# Patient Record
Sex: Male | Born: 1945
Health system: Southern US, Community
[De-identification: ages and names within clinical notes are randomized; demographics above are authoritative.]

## PROBLEM LIST (undated history)

## (undated) DIAGNOSIS — K219 Gastro-esophageal reflux disease without esophagitis: Secondary | ICD-10-CM

## (undated) DIAGNOSIS — T7840XA Allergy, unspecified, initial encounter: Secondary | ICD-10-CM

## (undated) DIAGNOSIS — E785 Hyperlipidemia, unspecified: Secondary | ICD-10-CM

## (undated) DIAGNOSIS — H409 Unspecified glaucoma: Secondary | ICD-10-CM

## (undated) HISTORY — PX: ROTATOR CUFF REPAIR: SHX139

## (undated) HISTORY — DX: Unspecified glaucoma: H40.9

## (undated) HISTORY — DX: Gastro-esophageal reflux disease without esophagitis: K21.9

## (undated) HISTORY — DX: Hyperlipidemia, unspecified: E78.5

## (undated) HISTORY — DX: Allergy, unspecified, initial encounter: T78.40XA

## (undated) HISTORY — PX: BACK SURGERY: SHX140

---

## 2016-12-03 DIAGNOSIS — E8881 Metabolic syndrome: Secondary | ICD-10-CM | POA: Diagnosis not present

## 2016-12-03 DIAGNOSIS — E669 Obesity, unspecified: Secondary | ICD-10-CM | POA: Diagnosis not present

## 2016-12-03 DIAGNOSIS — Z6832 Body mass index (BMI) 32.0-32.9, adult: Secondary | ICD-10-CM | POA: Diagnosis not present

## 2016-12-03 DIAGNOSIS — E1151 Type 2 diabetes mellitus with diabetic peripheral angiopathy without gangrene: Secondary | ICD-10-CM | POA: Diagnosis not present

## 2016-12-03 DIAGNOSIS — M75101 Unspecified rotator cuff tear or rupture of right shoulder, not specified as traumatic: Secondary | ICD-10-CM | POA: Diagnosis not present

## 2016-12-03 DIAGNOSIS — E782 Mixed hyperlipidemia: Secondary | ICD-10-CM | POA: Diagnosis not present

## 2016-12-03 DIAGNOSIS — Z0000 Encounter for general adult medical examination without abnormal findings: Secondary | ICD-10-CM | POA: Diagnosis not present

## 2016-12-03 DIAGNOSIS — J309 Allergic rhinitis, unspecified: Secondary | ICD-10-CM | POA: Diagnosis not present

## 2016-12-07 DIAGNOSIS — M47892 Other spondylosis, cervical region: Secondary | ICD-10-CM | POA: Diagnosis not present

## 2016-12-07 DIAGNOSIS — M5412 Radiculopathy, cervical region: Secondary | ICD-10-CM | POA: Diagnosis not present

## 2016-12-07 DIAGNOSIS — M19011 Primary osteoarthritis, right shoulder: Secondary | ICD-10-CM | POA: Diagnosis not present

## 2016-12-17 DIAGNOSIS — R7303 Prediabetes: Secondary | ICD-10-CM | POA: Diagnosis not present

## 2016-12-17 DIAGNOSIS — D519 Vitamin B12 deficiency anemia, unspecified: Secondary | ICD-10-CM | POA: Diagnosis not present

## 2016-12-17 DIAGNOSIS — M1990 Unspecified osteoarthritis, unspecified site: Secondary | ICD-10-CM | POA: Diagnosis not present

## 2016-12-17 DIAGNOSIS — E782 Mixed hyperlipidemia: Secondary | ICD-10-CM | POA: Diagnosis not present

## 2016-12-17 DIAGNOSIS — J309 Allergic rhinitis, unspecified: Secondary | ICD-10-CM | POA: Diagnosis not present

## 2016-12-17 DIAGNOSIS — N401 Enlarged prostate with lower urinary tract symptoms: Secondary | ICD-10-CM | POA: Diagnosis not present

## 2016-12-17 DIAGNOSIS — E559 Vitamin D deficiency, unspecified: Secondary | ICD-10-CM | POA: Diagnosis not present

## 2016-12-17 DIAGNOSIS — E669 Obesity, unspecified: Secondary | ICD-10-CM | POA: Diagnosis not present

## 2016-12-17 DIAGNOSIS — N400 Enlarged prostate without lower urinary tract symptoms: Secondary | ICD-10-CM | POA: Diagnosis not present

## 2017-01-18 DIAGNOSIS — G4730 Sleep apnea, unspecified: Secondary | ICD-10-CM | POA: Diagnosis not present

## 2017-01-18 DIAGNOSIS — E782 Mixed hyperlipidemia: Secondary | ICD-10-CM | POA: Diagnosis not present

## 2017-01-18 DIAGNOSIS — Z6832 Body mass index (BMI) 32.0-32.9, adult: Secondary | ICD-10-CM | POA: Diagnosis not present

## 2017-01-18 DIAGNOSIS — E1140 Type 2 diabetes mellitus with diabetic neuropathy, unspecified: Secondary | ICD-10-CM | POA: Diagnosis not present

## 2017-01-18 DIAGNOSIS — E559 Vitamin D deficiency, unspecified: Secondary | ICD-10-CM | POA: Diagnosis not present

## 2017-02-22 DIAGNOSIS — G629 Polyneuropathy, unspecified: Secondary | ICD-10-CM | POA: Diagnosis not present

## 2017-02-22 DIAGNOSIS — R0789 Other chest pain: Secondary | ICD-10-CM | POA: Diagnosis not present

## 2017-02-22 DIAGNOSIS — R079 Chest pain, unspecified: Secondary | ICD-10-CM | POA: Diagnosis not present

## 2017-02-22 DIAGNOSIS — M79602 Pain in left arm: Secondary | ICD-10-CM | POA: Diagnosis not present

## 2017-02-22 DIAGNOSIS — I10 Essential (primary) hypertension: Secondary | ICD-10-CM | POA: Diagnosis not present

## 2017-02-22 DIAGNOSIS — E7800 Pure hypercholesterolemia, unspecified: Secondary | ICD-10-CM | POA: Diagnosis not present

## 2017-02-22 DIAGNOSIS — R69 Illness, unspecified: Secondary | ICD-10-CM | POA: Diagnosis not present

## 2017-03-31 DIAGNOSIS — Z713 Dietary counseling and surveillance: Secondary | ICD-10-CM | POA: Diagnosis not present

## 2017-03-31 DIAGNOSIS — E559 Vitamin D deficiency, unspecified: Secondary | ICD-10-CM | POA: Diagnosis not present

## 2017-03-31 DIAGNOSIS — N401 Enlarged prostate with lower urinary tract symptoms: Secondary | ICD-10-CM | POA: Diagnosis not present

## 2017-03-31 DIAGNOSIS — R7303 Prediabetes: Secondary | ICD-10-CM | POA: Diagnosis not present

## 2017-03-31 DIAGNOSIS — E1151 Type 2 diabetes mellitus with diabetic peripheral angiopathy without gangrene: Secondary | ICD-10-CM | POA: Diagnosis not present

## 2017-03-31 DIAGNOSIS — Z6832 Body mass index (BMI) 32.0-32.9, adult: Secondary | ICD-10-CM | POA: Diagnosis not present

## 2017-03-31 DIAGNOSIS — E782 Mixed hyperlipidemia: Secondary | ICD-10-CM | POA: Diagnosis not present

## 2017-03-31 DIAGNOSIS — E669 Obesity, unspecified: Secondary | ICD-10-CM | POA: Diagnosis not present

## 2017-04-04 DIAGNOSIS — E559 Vitamin D deficiency, unspecified: Secondary | ICD-10-CM | POA: Diagnosis not present

## 2017-04-04 DIAGNOSIS — Z0389 Encounter for observation for other suspected diseases and conditions ruled out: Secondary | ICD-10-CM | POA: Diagnosis not present

## 2017-04-04 DIAGNOSIS — D510 Vitamin B12 deficiency anemia due to intrinsic factor deficiency: Secondary | ICD-10-CM | POA: Diagnosis not present

## 2017-04-04 DIAGNOSIS — M79672 Pain in left foot: Secondary | ICD-10-CM | POA: Diagnosis not present

## 2017-04-04 DIAGNOSIS — M19071 Primary osteoarthritis, right ankle and foot: Secondary | ICD-10-CM | POA: Diagnosis not present

## 2017-04-04 DIAGNOSIS — E782 Mixed hyperlipidemia: Secondary | ICD-10-CM | POA: Diagnosis not present

## 2017-04-04 DIAGNOSIS — M19072 Primary osteoarthritis, left ankle and foot: Secondary | ICD-10-CM | POA: Diagnosis not present

## 2017-04-04 DIAGNOSIS — G9009 Other idiopathic peripheral autonomic neuropathy: Secondary | ICD-10-CM | POA: Diagnosis not present

## 2017-04-04 DIAGNOSIS — N400 Enlarged prostate without lower urinary tract symptoms: Secondary | ICD-10-CM | POA: Diagnosis not present

## 2017-04-04 DIAGNOSIS — M79671 Pain in right foot: Secondary | ICD-10-CM | POA: Diagnosis not present

## 2017-04-04 DIAGNOSIS — M76822 Posterior tibial tendinitis, left leg: Secondary | ICD-10-CM | POA: Diagnosis not present

## 2017-04-04 DIAGNOSIS — I10 Essential (primary) hypertension: Secondary | ICD-10-CM | POA: Diagnosis not present

## 2017-04-04 DIAGNOSIS — B351 Tinea unguium: Secondary | ICD-10-CM | POA: Diagnosis not present

## 2017-04-04 DIAGNOSIS — M76821 Posterior tibial tendinitis, right leg: Secondary | ICD-10-CM | POA: Diagnosis not present

## 2017-04-28 DIAGNOSIS — H6122 Impacted cerumen, left ear: Secondary | ICD-10-CM | POA: Diagnosis not present

## 2017-04-28 DIAGNOSIS — H6092 Unspecified otitis externa, left ear: Secondary | ICD-10-CM | POA: Diagnosis not present

## 2017-05-10 ENCOUNTER — Telehealth: Payer: Self-pay | Admitting: Internal Medicine

## 2017-05-10 ENCOUNTER — Encounter: Payer: Self-pay | Admitting: Internal Medicine

## 2017-05-10 ENCOUNTER — Ambulatory Visit (INDEPENDENT_AMBULATORY_CARE_PROVIDER_SITE_OTHER): Payer: Medicare HMO | Admitting: Internal Medicine

## 2017-05-10 DIAGNOSIS — J302 Other seasonal allergic rhinitis: Secondary | ICD-10-CM | POA: Insufficient documentation

## 2017-05-10 DIAGNOSIS — E785 Hyperlipidemia, unspecified: Secondary | ICD-10-CM | POA: Insufficient documentation

## 2017-05-10 DIAGNOSIS — E7800 Pure hypercholesterolemia, unspecified: Secondary | ICD-10-CM

## 2017-05-10 DIAGNOSIS — H409 Unspecified glaucoma: Secondary | ICD-10-CM | POA: Insufficient documentation

## 2017-05-10 DIAGNOSIS — R7303 Prediabetes: Secondary | ICD-10-CM | POA: Insufficient documentation

## 2017-05-10 DIAGNOSIS — G629 Polyneuropathy, unspecified: Secondary | ICD-10-CM | POA: Insufficient documentation

## 2017-05-10 DIAGNOSIS — K219 Gastro-esophageal reflux disease without esophagitis: Secondary | ICD-10-CM

## 2017-05-10 DIAGNOSIS — J301 Allergic rhinitis due to pollen: Secondary | ICD-10-CM | POA: Diagnosis not present

## 2017-05-10 NOTE — Assessment & Plan Note (Signed)
Advised him to avoid spicy foods Weight loss can help reduce reflux as well Can take Tums, Rolaids or Maalox as needed

## 2017-05-10 NOTE — Assessment & Plan Note (Signed)
He reports he recently had labs done, will request records from previous PCP Encouraged him to consume a low fat diet

## 2017-05-10 NOTE — Assessment & Plan Note (Signed)
Continue Singulair as needed

## 2017-05-10 NOTE — Assessment & Plan Note (Signed)
He recently had labs done, will request records from previous PCP Encouraged him to consume a low fat diet

## 2017-05-10 NOTE — Patient Instructions (Signed)
Focal Neuropathy  Focal neuropathy is a nerve injury that affects one area of the body, such as an arm, a leg, or the face. The injury may involve one nerve or a small group of nerves. Examples of focal neuropathy include brachial plexus injury and Parson[place]-Turner syndrome.  What are the causes?  This condition may be caused by:   A sudden cut or stretch. This can happen because of an accident or during surgery.   A small injury that happens again and again.   A compression injury. This kind of injury happens when a blood vessel, a tumor, or something else presses on a nerve for a long time.   Entrapment. This can happen to a nerve that has to pass through a narrow area.   Lack of blood to the nerves. This can be caused by certain medical conditions, like diabetes or vasculitis.   Extreme cold.   Severe burns.   Radiation.    What are the signs or symptoms?  Symptoms of this condition depend on where the damaged nerve is and what kind of nerve it is. For example, damage to nerves that carry signals away from the brain can cause symptoms related to movement, but damage to nerves that carry signals to the brain can cause symptoms related to feeling and pain.  Symptoms affect only one area of the body. Common symptoms include:   Numbness.   Tingling.   A burning pain.   A prickling feeling.   Very sensitive skin.   Weakness.   Paralysis.   Muscle twitching.   Muscles getting smaller (muscle wasting).   Poor coordination.    How is this diagnosed?  This condition may be diagnosed with:   A neurologic exam. During the exam, your health care provider will check your reflexes, how you move, and what you can feel.   Tests. Tests may be done to help find the area that has been damaged. Tests may include:  ? Imaging tests, such as a CT scan or MRI.  ? Electromyogram (EMG). This test checks the nerves that control your muscles.  ? Nerve conduction velocity (NCV) tests. These tests check how quickly  messages pass through your nerves.    How is this treated?  Treatment for this condition depends on what damaged the nerve and how much of the nerve is damaged. Treatment may involve:   Surgery to ease pressure on a nerve. This may be done if you have a compression injury or entrapment.   Medicines, such as:  ? Medicines for pain, such painkillers, certain anti-seizure medicines, or antidepressants.  ? Steroid medicines. These may be given in pill form or with a shot (injection).   Physical therapy (PT) to help movement.   Splints or other devices to help movement.    Follow these instructions at home:     Take over-the-counter and prescription medicines only as told by your health care provider.   If you were given a splint or other device to help with movement, use it as told by your health care provider.   If any part of your body is numb, check it every day for signs of injury or infection. Watch for:  ? Redness, swelling, or pain.  ? Fluid or blood.  ? Warmth.  ? Pus or a bad smell.   Do not do things that put pressure on your damaged nerve. You may have to avoid certain activities or avoid sitting or lying in a certain way.     Do not smoke. Smoking keeps blood from getting to damaged nerves. If you need help quitting, ask your health care provider.   Limit alcohol intake, or avoid alcohol completely. Too much alcohol can cause a lack of B vitamins, which are needed for healthy nerves.   Have a good support system. Coping with focal neuropathy can be stressful. Talk with a mental health caregiver or join a support group if you are struggling.   Keep all follow-up visits as told by your health care provider. This is important. These include PT visits.  Contact a health care provider if:   You think you have an injury or infection.   You have new symptoms.   You are struggling emotionally from dealing with your condition.  Get help right away if:   You have severe pain that comes on  suddenly.   You develop any paralysis.   You lose feeling in any part of your body.  This information is not intended to replace advice given to you by your health care provider. Make sure you discuss any questions you have with your health care provider.  Document Released: 09/29/2015 Document Revised: 03/25/2016 Document Reviewed: 05/16/2015  Elsevier Interactive Patient Education  2018 Elsevier Inc.

## 2017-05-10 NOTE — Assessment & Plan Note (Signed)
He will continue eye drops at this time He will follow up with [facility] this week

## 2017-05-10 NOTE — Progress Notes (Signed)
HPI  Pt presents to the clinic today to establish care. He recently moved from [place]. He is transferring care from Dr. Humphrey Rolls.   Seasonal Allergies: Worse in the spring. He takes Singulair as needed with good relief.  GERD: Triggered by spicy foods. He has tried to avoid spicy foods.   HLD: He is currently not taking any cholesterol lowering medication. He tries to consume a low fat diet.  Glaucoma: He uses Alphagan and Lumigan as prescribed. He sees his eye doctor annually.  Borderline Diabetic with Neuropathy: He does not take anything to control his blood sugar. He takes Lyrica for the neuropathic pain. He thinks it works a little.  Flu: 07/2016 Tetanus: < 10 years Pneumovax: unsure Prevnar: unsure Zostavax: unsure Shingrix: unsure PSA Screening: unsure Colon Screening: Vision Screening: annually Dentist: as needed  Past Medical History:  Diagnosis Date  . Allergy   . GERD (gastroesophageal reflux disease)   . Glaucoma   . Hyperlipidemia     Current Outpatient Prescriptions  Medication Sig Dispense Refill  . acetaminophen (TYLENOL) 500 MG tablet Take 500 mg by mouth every 6 (six) hours as needed.    Marland Kitchen aspirin 81 MG tablet Take 81 mg by mouth daily.    . bimatoprost (LUMIGAN) 0.01 % SOLN Place 1 drop into both eyes at bedtime.    . brimonidine (ALPHAGAN P) 0.1 % SOLN Place 1 drop into both eyes at bedtime.    . Ibuprofen-Famotidine 800-26.6 MG TABS Take 1 tablet by mouth 3 times/day as needed-between meals & bedtime.    . montelukast (SINGULAIR) 10 MG tablet Take 10 mg by mouth at bedtime.    . pregabalin (LYRICA) 100 MG capsule Take 100 mg by mouth 2 (two) times daily.     No current facility-administered medications for this visit.     Allergies  Allergen Reactions  . Percocet [Oxycodone-Acetaminophen] Other (See Comments)    hallucinations    Family History  Problem Relation Age of Onset  . Breast cancer Sister   . Prostate cancer Brother   . Hyperlipidemia  Brother     Social History   Social History  . Marital status: Married    Spouse name: N/A  . Number of children: N/A  . Years of education: N/A   Occupational History  . Not on file.   Social History Main Topics  . Smoking status: Never Smoker  . Smokeless tobacco: Never Used  . Alcohol use Yes     Comment: occasional beer  . Drug use: No  . Sexual activity: Not on file   Other Topics Concern  . Not on file   Social History Narrative  . No narrative on file    ROS:  Constitutional: Denies fever, malaise, fatigue, headache or abrupt weight changes.  HEENT: Denies eye pain, eye redness, ear pain, ringing in the ears, wax buildup, runny nose, nasal congestion, bloody nose, or sore throat. Respiratory: Denies difficulty breathing, shortness of breath, cough or sputum production.   Cardiovascular: Denies chest pain, chest tightness, palpitations or swelling in the hands or feet.  Gastrointestinal: Denies abdominal pain, bloating, constipation, diarrhea or blood in the stool.  GU: Denies frequency, urgency, pain with urination, blood in urine, odor or discharge. Musculoskeletal: Denies decrease in range of motion, difficulty with gait, muscle pain or joint pain and swelling.  Skin: Denies redness, rashes, lesions or ulcercations.  Neurological: Pt reports numbness and tingling in his feet. Denies dizziness, difficulty with memory, difficulty with speech or problems  with balance and coordination.  Psych: Denies anxiety, depression, SI/HI.  No other specific complaints in a complete review of systems (except as listed in HPI above).  PE:  BP 118/76   Pulse 74   Temp 98.4 F (36.9 C) (Oral)   Ht 6' 1.5" (1.867 m)   Wt 254 lb (115.2 kg)   SpO2 97%   BMI 33.06 kg/m   Wt Readings from Last 3 Encounters:  05/10/17 254 lb (115.2 kg)    General: Appears her stated age, obese in NAD. HEENT: Head: normal shape and size; Throat/Mouth: Teeth present, mucosa pink and moist,  no lesions or ulcerations noted.  Neck: Neck supple, trachea midline. No masses, lumps or thyromegaly present.  Cardiovascular: Normal rate and rhythm. Pedal pulses 2+. Pulmonary/Chest: Normal effort and positive vesicular breath sounds. No respiratory distress. No wheezes, rales or ronchi noted.  Abdomen: Soft and nontender. Normal bowel sounds. Musculoskeletal: No difficulty with gait.  Neurological: Alert and oriented. Sensation intact to BLE. Psychiatric: Mood and affect normal. Behavior is normal. Judgment and thought content normal.    Assessment and Plan:

## 2017-05-10 NOTE — Assessment & Plan Note (Signed)
Continue Lyrica for now Will monitor

## 2017-05-10 NOTE — Telephone Encounter (Signed)
Labs received from previous PCP. Cholesterol very high. He needs to start the Crestor, please send in RX to pharmacy #30, 2 refills. Vit D very low. Send in Ergocalciferol 50000 units 1 x week #12, 0 refills. He does not need meds for diabetes. Follow up with me in 3 months.

## 2017-05-11 MED ORDER — VITAMIN D (ERGOCALCIFEROL) 1.25 MG (50000 UNIT) PO CAPS: 50000.0000 [IU] | ORAL_CAPSULE | ORAL | 0 refills | Status: DC

## 2017-05-11 MED ORDER — ROSUVASTATIN CALCIUM 40 MG PO TABS: 40.0000 mg | ORAL_TABLET | Freq: Every day | ORAL | 2 refills | Status: DC

## 2017-05-11 NOTE — Addendum Note (Signed)
Addended by: Lurlean Nanny on: 05/11/2017 05:10 PM   Modules accepted: Orders

## 2017-05-11 NOTE — Telephone Encounter (Signed)
Rx sent to pharmacy and 3 mth f/u scheduled

## 2017-05-18 ENCOUNTER — Encounter: Payer: Self-pay | Admitting: Internal Medicine

## 2017-05-18 DIAGNOSIS — H25013 Cortical age-related cataract, bilateral: Secondary | ICD-10-CM | POA: Diagnosis not present

## 2017-05-18 DIAGNOSIS — H5203 Hypermetropia, bilateral: Secondary | ICD-10-CM | POA: Diagnosis not present

## 2017-05-18 DIAGNOSIS — H401234 Low-tension glaucoma, bilateral, indeterminate stage: Secondary | ICD-10-CM | POA: Diagnosis not present

## 2017-05-18 DIAGNOSIS — H2513 Age-related nuclear cataract, bilateral: Secondary | ICD-10-CM | POA: Diagnosis not present

## 2017-05-18 DIAGNOSIS — H524 Presbyopia: Secondary | ICD-10-CM | POA: Diagnosis not present

## 2017-06-07 ENCOUNTER — Encounter: Payer: Self-pay | Admitting: Internal Medicine

## 2017-06-23 ENCOUNTER — Encounter: Payer: Self-pay | Admitting: Internal Medicine

## 2017-06-23 ENCOUNTER — Ambulatory Visit (INDEPENDENT_AMBULATORY_CARE_PROVIDER_SITE_OTHER): Payer: Medicare HMO | Admitting: Internal Medicine

## 2017-06-23 VITALS — BP 120/74 | HR 60 | Temp 98.1°F | Wt 257.0 lb

## 2017-06-23 DIAGNOSIS — H401232 Low-tension glaucoma, bilateral, moderate stage: Secondary | ICD-10-CM | POA: Diagnosis not present

## 2017-06-23 DIAGNOSIS — G44201 Tension-type headache, unspecified, intractable: Secondary | ICD-10-CM | POA: Diagnosis not present

## 2017-06-23 DIAGNOSIS — J029 Acute pharyngitis, unspecified: Secondary | ICD-10-CM | POA: Diagnosis not present

## 2017-06-23 DIAGNOSIS — B370 Candidal stomatitis: Secondary | ICD-10-CM | POA: Diagnosis not present

## 2017-06-23 DIAGNOSIS — M542 Cervicalgia: Secondary | ICD-10-CM | POA: Diagnosis not present

## 2017-06-23 MED ORDER — NYSTATIN 100000 UNIT/ML MT SUSP: 5.0000 mL | Freq: Four times a day (QID) | OROMUCOSAL | 0 refills | Status: DC

## 2017-06-23 NOTE — Progress Notes (Signed)
Subjective:    Patient ID: Timothy Alvarado, male    DOB: July 01, 1946, 71 y.o.   MRN: [id number]  HPI  Pt presents to the clinic today with c/o headache and sore throat. He reports this started 1 week ago. The headache is located in the right temple. He describes the pain as throbbing. He denies visual changes, dizziness, sensitivity to light or sound. He reports associated neck pain bilaterally. He denies difficulty swallowing. He denies runny nose, nasal congestion, or cough. He has tried Tylenol with minimal relief. He has a history of seasonal allergies and takes Singulair daily. He has not had sick contacts.  Review of Systems      Past Medical History:  Diagnosis Date  . Allergy   . GERD (gastroesophageal reflux disease)   . Glaucoma   . Hyperlipidemia     Current Outpatient Prescriptions  Medication Sig Dispense Refill  . acetaminophen (TYLENOL) 500 MG tablet Take 500 mg by mouth every 6 (six) hours as needed.    Marland Kitchen aspirin 81 MG tablet Take 81 mg by mouth daily.    . bimatoprost (LUMIGAN) 0.01 % SOLN Place 1 drop into both eyes at bedtime.    . brimonidine (ALPHAGAN P) 0.1 % SOLN Place 1 drop into both eyes at bedtime.    . Ibuprofen-Famotidine 800-26.6 MG TABS Take 1 tablet by mouth 3 times/day as needed-between meals & bedtime.    . montelukast (SINGULAIR) 10 MG tablet Take 10 mg by mouth at bedtime.    . pregabalin (LYRICA) 100 MG capsule Take 100 mg by mouth 2 (two) times daily.    . rosuvastatin (CRESTOR) 40 MG tablet Take 1 tablet (40 mg total) by mouth daily. 30 tablet 2  . Vitamin D, Ergocalciferol, (DRISDOL) 50000 units CAPS capsule Take 1 capsule (50,000 Units total) by mouth every 7 (seven) days. 12 capsule 0   No current facility-administered medications for this visit.     Allergies  Allergen Reactions  . Percocet [Oxycodone-Acetaminophen] Other (See Comments)    hallucinations    Family History  Problem Relation Age of Onset  . Breast cancer Sister   .  Prostate cancer Brother   . Hyperlipidemia Brother     Social History   Social History  . Marital status: Married    Spouse name: N/A  . Number of children: N/A  . Years of education: N/A   Occupational History  . Not on file.   Social History Main Topics  . Smoking status: Never Smoker  . Smokeless tobacco: Never Used  . Alcohol use Yes     Comment: occasional beer  . Drug use: No  . Sexual activity: No   Other Topics Concern  . Not on file   Social History Narrative  . No narrative on file     Constitutional: Pt reports headache. Denies fever, malaise, fatigue, or abrupt weight changes.  HEENT: Pt reports sore throat. Denies eye pain, eye redness, ear pain, ringing in the ears, wax buildup, runny nose, nasal congestion, bloody nose. Respiratory: Denies difficulty breathing, shortness of breath, cough or sputum production.   MSK: Pt reports neck pain. Denies decrease in ROM.  No other specific complaints in a complete review of systems (except as listed in HPI above).  Objective:   Physical Exam   BP 120/74   Pulse 60   Temp 98.1 F (36.7 C) (Oral)   Wt 257 lb (116.6 kg)   SpO2 98%   BMI 33.45 kg/m  Wt  Readings from Last 3 Encounters:  06/23/17 257 lb (116.6 kg)  05/10/17 254 lb (115.2 kg)    General: Appears his stated age, in NAD. HEENT:  Throat/Mouth: Teeth present, mucosa pink and moist, no exudate, lesions or ulcerations noted. White coating noted on tongue. Neck:  No adenopathy noted. Musculoskeletal: Normal flexion, extension and rotation of the cervical spine. No bony tenderness noted over the spine. Pain with palpation of the bilateral parecervical muscles.       Assessment & Plan:   Sore Throat secondary to Oral Thrush:  eRx for Nystatin swish and swallow x 5 days  Headache secondary to MSK Neck Pain:  Advised him to try Ibuprofen instead of Tylenol Neck exercises given Heat and massage may be helpful  Return precautions  discussed Webb Silversmith, NP

## 2017-06-23 NOTE — Patient Instructions (Signed)
Oral Thrush, Adult Oral thrush is an infection in your mouth and throat. It causes white patches on your tongue and in your mouth. Follow these instructions at home: Helping with soreness  To lessen your pain: ? Drink cold liquids, like water and iced tea. ? Eat frozen ice pops or frozen juices. ? Eat foods that are easy to swallow, like gelatin and ice cream. ? Drink from a straw if the patches in your mouth are painful. General instructions   Take or use over-the-counter and prescription medicines only as told by your doctor. Medicine for oral thrush may be something to swallow, or it may be something to put on the infected area.  Eat plain yogurt that has live cultures in it. Read the label to make sure.  If you wear dentures: ? Take out your dentures before you go to bed. ? Brush them well. ? Soak them in a denture cleaner.  Rinse your mouth with warm salt-water many times a day. To make the salt-water mixture, completely dissolve 1/2-1 teaspoon of salt in 1 cup of warm water. Contact a doctor if:  Your problems are getting worse.  Your problems do not get better in less than 7 days with treatment.  Your infection is spreading. This may show as white patches on the skin outside of your mouth.  You are nursing your baby and you have redness and pain in the nipples. This information is not intended to replace advice given to you by your health care provider. Make sure you discuss any questions you have with your health care provider. Document Released: 01/12/2010 Document Revised: 07/12/2016 Document Reviewed: 07/12/2016 Elsevier Interactive Patient Education  2017 Elsevier Inc.  

## 2017-07-18 ENCOUNTER — Ambulatory Visit (INDEPENDENT_AMBULATORY_CARE_PROVIDER_SITE_OTHER): Payer: Medicare HMO | Admitting: Internal Medicine

## 2017-07-18 ENCOUNTER — Encounter: Payer: Self-pay | Admitting: Internal Medicine

## 2017-07-18 VITALS — BP 120/76 | HR 70 | Temp 98.1°F | Wt 253.0 lb

## 2017-07-18 DIAGNOSIS — J029 Acute pharyngitis, unspecified: Secondary | ICD-10-CM | POA: Diagnosis not present

## 2017-07-18 DIAGNOSIS — R140 Abdominal distension (gaseous): Secondary | ICD-10-CM

## 2017-07-18 LAB — COMPREHENSIVE METABOLIC PANEL
ALBUMIN: 4.1 g/dL (ref 3.5–5.2)
ALT: 18 U/L (ref 0–53)
AST: 14 U/L (ref 0–37)
Alkaline Phosphatase: 63 U/L (ref 39–117)
BILIRUBIN TOTAL: 1 mg/dL (ref 0.2–1.2)
BUN: 15 mg/dL (ref 6–23)
CALCIUM: 9.5 mg/dL (ref 8.4–10.5)
CO2: 27 mEq/L (ref 19–32)
CREATININE: 1.26 mg/dL (ref 0.40–1.50)
Chloride: 103 mEq/L (ref 96–112)
GFR: 72.93 mL/min (ref >60.00)
Glucose, Bld: 98 mg/dL (ref 70–99)
Potassium: 4.3 mEq/L (ref 3.5–5.1)
SODIUM: 137 meq/L (ref 135–145)
Total Protein: 7.5 g/dL (ref 6.0–8.3)

## 2017-07-18 LAB — CBC
HCT: 43.8 % (ref 39.0–52.0)
Hemoglobin: 14.3 g/dL (ref 13.0–17.0)
MCHC: 32.7 g/dL (ref 30.0–36.0)
MCV: 91.9 fl (ref 78.0–100.0)
PLATELETS: 256 10*3/uL (ref 150.0–400.0)
RBC: 4.76 Mil/uL (ref 4.22–5.81)
RDW: 15.6 % — ABNORMAL HIGH (ref 11.5–15.5)
WBC: 4.8 10*3/uL (ref 4.0–10.5)

## 2017-07-18 LAB — H. PYLORI ANTIBODY, IGG: H PYLORI IGG: NEGATIVE

## 2017-07-18 MED ORDER — PANTOPRAZOLE SODIUM 40 MG PO TBEC
40.0000 mg | DELAYED_RELEASE_TABLET | Freq: Every day | ORAL | 2 refills | Status: DC
Start: 2017-07-18 — End: 2018-05-24

## 2017-07-18 NOTE — Progress Notes (Signed)
Subjective:    Patient ID: Timothy Alvarado, male    DOB: 03-21-1946, 71 y.o.   MRN: [id number]  HPI  Pt presents to the clinic today with c/o persist sore throat. He reports he feels like his throat starts to swell up after eating. He reports this causes him difficulty breathing. He denies swelling of the lips or tongue. He denies cough. He does have some abdominal bloating but denies epigastric pain. He reports he called 911 for the same last week. ECG and vitals were normal. They advised him to try Zantac OTC, which he has tried without relief. He has also tried Zyrtec in the past, for possible allergies without much improvement.  Review of Systems      Past Medical History:  Diagnosis Date  . Allergy   . GERD (gastroesophageal reflux disease)   . Glaucoma   . Hyperlipidemia     Current Outpatient Prescriptions  Medication Sig Dispense Refill  . acetaminophen (TYLENOL) 500 MG tablet Take 500 mg by mouth every 6 (six) hours as needed.    Marland Kitchen aspirin 81 MG tablet Take 81 mg by mouth daily.    . bimatoprost (LUMIGAN) 0.01 % SOLN Place 1 drop into both eyes at bedtime.    . brimonidine (ALPHAGAN P) 0.1 % SOLN Place 1 drop into both eyes at bedtime.    . Ibuprofen-Famotidine 800-26.6 MG TABS Take 1 tablet by mouth 3 times/day as needed-between meals & bedtime.    . montelukast (SINGULAIR) 10 MG tablet Take 10 mg by mouth at bedtime.    Marland Kitchen nystatin (MYCOSTATIN) 100000 UNIT/ML suspension Take 5 mLs (500,000 Units total) by mouth 4 (four) times daily. 120 mL 0  . pregabalin (LYRICA) 100 MG capsule Take 100 mg by mouth 2 (two) times daily.    . ranitidine (ZANTAC) 150 MG tablet Take 150 mg by mouth as needed for heartburn.    . rosuvastatin (CRESTOR) 40 MG tablet Take 1 tablet (40 mg total) by mouth daily. 30 tablet 2  . Vitamin D, Ergocalciferol, (DRISDOL) 50000 units CAPS capsule Take 1 capsule (50,000 Units total) by mouth every 7 (seven) days. 12 capsule 0   No current facility-administered  medications for this visit.     Allergies  Allergen Reactions  . Percocet [Oxycodone-Acetaminophen] Other (See Comments)    hallucinations    Family History  Problem Relation Age of Onset  . Breast cancer Sister   . Prostate cancer Brother   . Hyperlipidemia Brother     Social History   Social History  . Marital status: Married    Spouse name: N/A  . Number of children: N/A  . Years of education: N/A   Occupational History  . Not on file.   Social History Main Topics  . Smoking status: Never Smoker  . Smokeless tobacco: Never Used  . Alcohol use Yes     Comment: occasional beer  . Drug use: No  . Sexual activity: No   Other Topics Concern  . Not on file   Social History Narrative  . No narrative on file     Constitutional: Denies fever, malaise, fatigue, headache or abrupt weight changes.  HEENT: Pt reports sore throat. Denies eye pain, eye redness, ear pain, ringing in the ears, wax buildup, runny nose, nasal congestion, bloody nose, or sore throat. Respiratory: Pt reports difficulty breathing. Denies shortness of breath, cough or sputum production.   Cardiovascular: Denies chest pain, chest tightness, palpitations or swelling in the hands or feet.  Gastrointestinal: Pt reports bloating. Denies abdominal pain, constipation, diarrhea or blood in the stool.    No other specific complaints in a complete review of systems (except as listed in HPI above).  Objective:   Physical Exam  BP 120/76   Pulse 70   Temp 98.1 F (36.7 C) (Oral)   Wt 253 lb (114.8 kg)   SpO2 97%   BMI 32.93 kg/m  Wt Readings from Last 3 Encounters:  07/18/17 253 lb (114.8 kg)  06/23/17 257 lb (116.6 kg)  05/10/17 254 lb (115.2 kg)    General: Appears his stated age, obese in NAD. HEENT: Throat/Mouth: Teeth present, mucosa pink and moist, no exudate, lesions or ulcerations noted.  Neck:  No adenopathy noted. Cardiovascular: Normal rate and rhythm.  Pulmonary/Chest: Normal  effort and positive vesicular breath sounds. No respiratory distress. No wheezes, rales or ronchi noted.  Abdomen: Soft and nontender. Normal bowel sounds. No distention or masses noted.      Assessment & Plan:   Sore Throat, Bloating:  ? Reflux Advised him to keep a food diary so that we can see what is triggering this Will check CBC, CMET, and H Pylori today Stop Zantac eRx for Protonix 40 mg daily Advised him to continue Zyrtec Consider referral to GI if H Pylori negative.  Return precaution disccussed Webb Silversmith, NP

## 2017-07-18 NOTE — Patient Instructions (Signed)
Gastroesophageal Reflux Scan A gastroesophageal reflux scan is a procedure that is used to check for gastroesophageal reflux, which is the backward flow of stomach contents into the tube that carries food from the mouth to the stomach (esophagus). The scan can also show if any stomach contents are inhaled (aspirated) into your lungs. You may need this scan if you have symptoms such as heartburn, vomiting, swallowing problems, or regurgitation. Regurgitation means that swallowed food is returning from the stomach to the esophagus. For this scan, you will drink a liquid that contains a small amount of a radioactive substance (tracer). A scanner with a camera that detects the radioactive tracer is used to see if any of the material backs up into your esophagus. Tell a health care provider about:  Any allergies you have.  All medicines you are taking, including vitamins, herbs, eye drops, creams, and over-the-counter medicines.  Any blood disorders you have.  Any surgeries you have had.  Any medical conditions you have.  If you are pregnant or you think that you may be pregnant.  If you are breastfeeding. What are the risks? Generally, this is a safe procedure. However, problems may occur, including:  Exposure to radiation (a small amount).  Allergic reaction to the radioactive substance. This is rare.  What happens before the procedure?  Ask your health care provider about changing or stopping your regular medicines. This is especially important if you are taking diabetes medicines or blood thinners.  Follow your health care provider's instructions about eating or drinking restrictions. What happens during the procedure?  You will be asked to drink a liquid that contains a small amount of a radioactive tracer. This liquid will probably be similar to orange juice.  You will assume a position lying on your back.  A series of images will be taken of your esophagus and upper  stomach.  You may be asked to move into different positions to help determine if reflux occurs more often when you are in specific positions.  For adults, an abdominal binder with an inflatable cuff may be placed on the belly (abdomen). This may be used to increase abdominal pressure. More images will be taken to see if the increased pressure causes reflux to occur. The procedure may vary among health care providers and hospitals. What happens after the procedure?  Return to your normal activities and your normal diet as directed by your health care provider.  The radioactive tracer will leave your body over the next few days. Drink enough fluid to keep your urine clear or pale yellow. This will help to flush the tracer out of your body.  It is your responsibility to obtain your test results. Ask your health care provider or the department performing the test when and how you will get your results. This information is not intended to replace advice given to you by your health care provider. Make sure you discuss any questions you have with your health care provider. Document Released: 12/09/2005 Document Revised: 07/12/2016 Document Reviewed: 07/30/2014 Elsevier Interactive Patient Education  2018 Elsevier Inc.  

## 2017-07-21 ENCOUNTER — Telehealth: Payer: Self-pay | Admitting: Internal Medicine

## 2017-07-21 NOTE — Telephone Encounter (Signed)
Patient returned Melanie's call about his lab results.

## 2017-08-04 ENCOUNTER — Other Ambulatory Visit: Payer: Self-pay

## 2017-08-04 MED ORDER — PREGABALIN 100 MG PO CAPS: 100.0000 mg | ORAL_CAPSULE | Freq: Two times a day (BID) | ORAL | 0 refills | Status: DC

## 2017-08-04 NOTE — Telephone Encounter (Signed)
Ok to phone in [place]

## 2017-08-04 NOTE — Telephone Encounter (Signed)
Rx called in to pharmacy. 

## 2017-08-04 NOTE — Telephone Encounter (Signed)
Pt left v/m requesting lyrica to [facility].  Avie Echevaria NP has never filled;last filled form [place]. Pt established care 05/10/17 with office note to continue Lyrica for now; will monitor.Please advise.

## 2017-08-08 ENCOUNTER — Ambulatory Visit (INDEPENDENT_AMBULATORY_CARE_PROVIDER_SITE_OTHER): Payer: Medicare HMO | Admitting: Internal Medicine

## 2017-08-08 ENCOUNTER — Encounter: Payer: Self-pay | Admitting: Internal Medicine

## 2017-08-08 VITALS — BP 110/74 | HR 86 | Temp 98.2°F | Wt 251.5 lb

## 2017-08-08 DIAGNOSIS — R490 Dysphonia: Secondary | ICD-10-CM

## 2017-08-08 DIAGNOSIS — E559 Vitamin D deficiency, unspecified: Secondary | ICD-10-CM | POA: Diagnosis not present

## 2017-08-08 DIAGNOSIS — Z23 Encounter for immunization: Secondary | ICD-10-CM

## 2017-08-08 DIAGNOSIS — J029 Acute pharyngitis, unspecified: Secondary | ICD-10-CM

## 2017-08-08 DIAGNOSIS — R51 Headache: Secondary | ICD-10-CM

## 2017-08-08 DIAGNOSIS — E7800 Pure hypercholesterolemia, unspecified: Secondary | ICD-10-CM | POA: Diagnosis not present

## 2017-08-08 DIAGNOSIS — R519 Headache, unspecified: Secondary | ICD-10-CM

## 2017-08-08 LAB — COMPREHENSIVE METABOLIC PANEL
ALT: 25 U/L (ref 0–53)
AST: 19 U/L (ref 0–37)
Albumin: 4.1 g/dL (ref 3.5–5.2)
Alkaline Phosphatase: 56 U/L (ref 39–117)
BILIRUBIN TOTAL: 1.2 mg/dL (ref 0.2–1.2)
BUN: 17 mg/dL (ref 6–23)
CALCIUM: 9.5 mg/dL (ref 8.4–10.5)
CO2: 27 meq/L (ref 19–32)
CREATININE: 1.3 mg/dL (ref 0.40–1.50)
Chloride: 104 mEq/L (ref 96–112)
GFR: 70.34 mL/min (ref >60.00)
GLUCOSE: 96 mg/dL (ref 70–99)
Potassium: 4.3 mEq/L (ref 3.5–5.1)
SODIUM: 139 meq/L (ref 135–145)
Total Protein: 7.6 g/dL (ref 6.0–8.3)

## 2017-08-08 LAB — LIPID PANEL
CHOL/HDL RATIO: 3
Cholesterol: 157 mg/dL (ref 0–200)
HDL: 46.7 mg/dL (ref >39.00)
LDL CALC: 94 mg/dL (ref 0–99)
NONHDL: 110.17
TRIGLYCERIDES: 79 mg/dL (ref 0.0–149.0)
VLDL: 15.8 mg/dL (ref 0.0–40.0)

## 2017-08-08 LAB — VITAMIN D 25 HYDROXY (VIT D DEFICIENCY, FRACTURES): VITD: 39.99 ng/mL (ref 30.00–100.00)

## 2017-08-08 NOTE — Addendum Note (Signed)
Addended by: Emelia Salisbury C on: 08/08/2017 11:51 AM   Modules accepted: Orders

## 2017-08-08 NOTE — Patient Instructions (Signed)
Fat and Cholesterol Restricted Diet Getting too much fat and cholesterol in your diet may cause health problems. Following this diet helps keep your fat and cholesterol at normal levels. This can keep you from getting sick. What types of fat should I choose?  Choose monosaturated and polyunsaturated fats. These are found in foods such as olive oil, canola oil, flaxseeds, walnuts, almonds, and seeds.  Eat more omega-3 fats. Good choices include salmon, mackerel, sardines, tuna, flaxseed oil, and ground flaxseeds.  Limit saturated fats. These are in animal products such as meats, butter, and cream. They can also be in plant products such as palm oil, palm kernel oil, and coconut oil.  Avoid foods with partially hydrogenated oils in them. These contain trans fats. Examples of foods that have trans fats are stick margarine, some tub margarines, cookies, crackers, and other baked goods. What general guidelines do I need to follow?  Check food labels. Look for the words "trans fat" and "saturated fat."  When preparing a meal: ? Fill half of your plate with vegetables and green salads. ? Fill one fourth of your plate with whole grains. Look for the word "whole" as the first word in the ingredient list. ? Fill one fourth of your plate with lean protein foods.  Eat more foods that have fiber, like apples, carrots, beans, peas, and barley.  Eat more home-cooked foods. Eat less at restaurants and buffets.  Limit or avoid alcohol.  Limit foods high in starch and sugar.  Limit fried foods.  Cook foods without frying them. Baking, boiling, grilling, and broiling are all great options.  Lose weight if you are overweight. Losing even a small amount of weight can help your overall health. It can also help prevent diseases such as diabetes and heart disease. What foods can I eat? Grains Whole grains, such as whole wheat or whole grain breads, crackers, cereals, and pasta. Unsweetened oatmeal,  bulgur, barley, quinoa, or brown rice. Corn or whole wheat flour tortillas. Vegetables Fresh or frozen vegetables (raw, steamed, roasted, or grilled). Green salads. Fruits All fresh, canned (in natural juice), or frozen fruits. Meat and Other Protein Products Ground beef (85% or leaner), grass-fed beef, or beef trimmed of fat. Skinless chicken or turkey. Ground chicken or turkey. Pork trimmed of fat. All fish and seafood. Eggs. Dried beans, peas, or lentils. Unsalted nuts or seeds. Unsalted canned or dry beans. Dairy Low-fat dairy products, such as skim or 1% milk, 2% or reduced-fat cheeses, low-fat ricotta or cottage cheese, or plain low-fat yogurt. Fats and Oils Tub margarines without trans fats. Light or reduced-fat mayonnaise and salad dressings. Avocado. Olive, canola, sesame, or safflower oils. Natural peanut or almond butter (choose ones without added sugar and oil). The items listed above may not be a complete list of recommended foods or beverages. Contact your dietitian for more options. What foods are not recommended? Grains White bread. White pasta. White rice. Cornbread. Bagels, pastries, and croissants. Crackers that contain trans fat. Vegetables White potatoes. Corn. Creamed or fried vegetables. Vegetables in a cheese sauce. Fruits Dried fruits. Canned fruit in light or heavy syrup. Fruit juice. Meat and Other Protein Products Fatty cuts of meat. Ribs, chicken wings, bacon, sausage, bologna, salami, chitterlings, fatback, hot dogs, bratwurst, and packaged luncheon meats. Liver and organ meats. Dairy Whole or 2% milk, cream, half-and-half, and cream cheese. Whole milk cheeses. Whole-fat or sweetened yogurt. Full-fat cheeses. Nondairy creamers and whipped toppings. Processed cheese, cheese spreads, or cheese curds. Sweets and Desserts Corn   syrup, sugars, honey, and molasses. Candy. Jam and jelly. Syrup. Sweetened cereals. Cookies, pies, cakes, donuts, muffins, and ice  cream. Fats and Oils Butter, stick margarine, lard, shortening, ghee, or bacon fat. Coconut, palm kernel, or palm oils. Beverages Alcohol. Sweetened drinks (such as sodas, lemonade, and fruit drinks or punches). The items listed above may not be a complete list of foods and beverages to avoid. Contact your dietitian for more information. This information is not intended to replace advice given to you by your health care provider. Make sure you discuss any questions you have with your health care provider. Document Released: 04/18/2012 Document Revised: 06/24/2016 Document Reviewed: 01/17/2014 Elsevier Interactive Patient Education  2018 [employer] Inc[employer]  

## 2017-08-08 NOTE — Assessment & Plan Note (Signed)
CMET and Lipid profile today Encouraged him to consume a low fat diet Continue Crestor, will adjust as needed based on labs

## 2017-08-08 NOTE — Progress Notes (Signed)
Subjective:    Patient ID: Timothy Alvarado, male    DOB: 20-Aug-1946, 71 y.o.   MRN: [id number]  HPI  Pt presents to the clinic today to follow up HLD and Vit D deficiency.  HLD: His last LDL was 241. He was started on Crestor, and advised to consume a low fat diet. He has been taking the medication as prescribed. He denies myalgias. He is due for repeat labs.  Vit D: He was started on Ergocalciferol 50000 units weekly x 12 weeks. He has taken all the medication as prescribed.   He also c/o of headache, hoarseness and sore throat. This has been going on for 1 week. The headache is located in his forehead. He describes to pain as pressure. He denies associated dizziness, visual changes, runny nose, nasal congestion or ear pain. He denies difficulty swallowing. He reports it is sore after he eats or drinks. It also hurts to the touch. He describes a tightness of his throat but he does not choke on his food. He has reflux but is taking Protonix daily. He has not tried anything OTC for his symptoms.  Review of Systems      Past Medical History:  Diagnosis Date  . Allergy   . GERD (gastroesophageal reflux disease)   . Glaucoma   . Hyperlipidemia     Current Outpatient Prescriptions  Medication Sig Dispense Refill  . acetaminophen (TYLENOL) 500 MG tablet Take 500 mg by mouth every 6 (six) hours as needed.    Marland Kitchen aspirin 81 MG tablet Take 81 mg by mouth daily.    . bimatoprost (LUMIGAN) 0.01 % SOLN Place 1 drop into both eyes at bedtime.    . brimonidine (ALPHAGAN P) 0.1 % SOLN Place 1 drop into both eyes at bedtime.    . Ibuprofen-Famotidine 800-26.6 MG TABS Take 1 tablet by mouth 3 times/day as needed-between meals & bedtime.    . montelukast (SINGULAIR) 10 MG tablet Take 10 mg by mouth at bedtime.    Marland Kitchen nystatin (MYCOSTATIN) 100000 UNIT/ML suspension Take 5 mLs (500,000 Units total) by mouth 4 (four) times daily. 120 mL 0  . pantoprazole (PROTONIX) 40 MG tablet Take 1 tablet (40 mg total) by  mouth daily. 30 tablet 2  . pregabalin (LYRICA) 100 MG capsule Take 1 capsule (100 mg total) by mouth 2 (two) times daily. 60 capsule 0  . rosuvastatin (CRESTOR) 40 MG tablet Take 1 tablet (40 mg total) by mouth daily. 30 tablet 2  . Vitamin D, Ergocalciferol, (DRISDOL) 50000 units CAPS capsule Take 1 capsule (50,000 Units total) by mouth every 7 (seven) days. 12 capsule 0   No current facility-administered medications for this visit.     Allergies  Allergen Reactions  . Percocet [Oxycodone-Acetaminophen] Other (See Comments)    hallucinations    Family History  Problem Relation Age of Onset  . Breast cancer Sister   . Prostate cancer Brother   . Hyperlipidemia Brother     Social History   Social History  . Marital status: Married    Spouse name: N/A  . Number of children: N/A  . Years of education: N/A   Occupational History  . Not on file.   Social History Main Topics  . Smoking status: Never Smoker  . Smokeless tobacco: Never Used  . Alcohol use Yes     Comment: occasional beer  . Drug use: No  . Sexual activity: No   Other Topics Concern  . Not on file  Social History Narrative  . No narrative on file     Constitutional: Pt reports headaches. Denies fever, malaise, fatigue, or abrupt weight changes.  HEENT: Pt reports sore throat. Denies eye pain, eye redness, ear pain, ringing in the ears, wax buildup, runny nose, nasal congestion, bloody nose. Respiratory: Denies difficulty breathing, shortness of breath, cough or sputum production.   Cardiovascular: Denies chest pain, chest tightness, palpitations or swelling in the hands or feet.  Gastrointestinal: Denies abdominal pain, bloating, constipation, diarrhea or blood in the stool.   No other specific complaints in a complete review of systems (except as listed in HPI above).  Objective:   Physical Exam   BP 110/74   Pulse 86   Temp 98.2 F (36.8 C) (Oral)   Wt 251 lb 8 oz (114.1 kg)   BMI 32.73  kg/m  Wt Readings from Last 3 Encounters:  08/08/17 251 lb 8 oz (114.1 kg)  07/18/17 253 lb (114.8 kg)  06/23/17 257 lb (116.6 kg)    General: Appears his stated age, in NAD. HEENT: Head: normal shape and size; Throat/Mouth: Teeth present, mucosa pink and moist, + PND, no exudate, lesions or ulcerations noted.  Neck:  No adenopathy noted. Cardiovascular: Normal rate and rhythm.  Pulmonary/Chest: Normal effort and positive vesicular breath sounds. No respiratory distress. No wheezes, rales or ronchi noted.  Neurological: Alert and oriented.    BMET    Component Value Date/Time   NA 137 07/18/2017 1032   K 4.3 07/18/2017 1032   CL 103 07/18/2017 1032   CO2 27 07/18/2017 1032   GLUCOSE 98 07/18/2017 1032   BUN 15 07/18/2017 1032   CREATININE 1.26 07/18/2017 1032   CALCIUM 9.5 07/18/2017 1032    Lipid Panel  No results found for: CHOL, TRIG, HDL, CHOLHDL, VLDL, LDLCALC  CBC    Component Value Date/Time   WBC 4.8 07/18/2017 1032   RBC 4.76 07/18/2017 1032   HGB 14.3 07/18/2017 1032   HCT 43.8 07/18/2017 1032   PLT 256.0 07/18/2017 1032   MCV 91.9 07/18/2017 1032   MCHC 32.7 07/18/2017 1032   RDW 15.6 (H) 07/18/2017 1032    Hgb A1C No results found for: HGBA1C         Assessment & Plan:   Sore Throat, secondary to PND:  Likely allergy related Could be some silent reflux, but he is already on [employer] Zyrtec daily If no improvement, will refer to GI  Vit D Deficiency:  Repeat Vit D today  Make an appt in 3 months for your Medicare Wellness Exam Webb Silversmith, NP

## 2017-08-12 ENCOUNTER — Other Ambulatory Visit: Payer: Self-pay | Admitting: Internal Medicine

## 2017-08-12 DIAGNOSIS — K219 Gastro-esophageal reflux disease without esophagitis: Secondary | ICD-10-CM

## 2017-09-06 ENCOUNTER — Telehealth: Payer: Self-pay

## 2017-09-06 NOTE — Telephone Encounter (Signed)
Pt. Called c/o cold symptoms : runny nose,sinus pressure, headache, body aches. No fever. Has not tried OTC cold medication. Pt. Has appointment tomorrow. Advised pt. To try OTC cold medication and also try saline nasal spray. Instructed if he feels wors or has a fever to call back. Verbalizes understanding.

## 2017-09-07 ENCOUNTER — Ambulatory Visit: Payer: Medicare HMO | Admitting: Internal Medicine

## 2017-09-09 ENCOUNTER — Ambulatory Visit: Payer: Medicare HMO | Admitting: Gastroenterology

## 2017-09-09 ENCOUNTER — Encounter: Payer: Self-pay | Admitting: Gastroenterology

## 2017-09-09 VITALS — BP 114/69 | HR 70 | Temp 98.6°F | Ht 75.0 in | Wt 253.2 lb

## 2017-09-09 DIAGNOSIS — R1013 Epigastric pain: Secondary | ICD-10-CM | POA: Diagnosis not present

## 2017-09-09 DIAGNOSIS — K219 Gastro-esophageal reflux disease without esophagitis: Secondary | ICD-10-CM | POA: Diagnosis not present

## 2017-09-09 NOTE — Progress Notes (Signed)
Timothy Antigua, MD [address] [place], [place], [place], [address] [address], [place], [place], [place], [address] Phone: [phone]  Fax: [phone]  Consultation  Referring Provider:     Jearld Fenton, NP Primary Care Physician:  Timothy Fenton, NP Primary Gastroenterologist:  Timothy Manifold, MD        Reason for Consultation: Dyspepsia, acid reflux     Date of Consultation:  09/09/2017         HPI:   Timothy Alvarado is a 71 y.o. male referred from primary care provider due to complaints of acid reflux.  Months ago patient had a URI and at that time complained of a sore throat which improved with Zyrtec and Protonix.  He also has symptoms of acid reflux with bad taste in his mouth at times.  This is better since he was started on Protonix which he takes every morning 30 minutes before breakfast.  No weight loss or dysphagia.  At this time he complains of "gas" every other day related to meals at times that gets better after a bowel movement.  He reports 1-2 soft films every day.  No blood in stool or melena.  No emesis or nausea.  He denies any abdominal surgeries.  Reports last colonoscopy was about 5 years ago in [place] and states that it was normal.  States he has had 2 or 3 colonoscopies and all of them are normal with no polyps (records not available).  States during his last colonoscopy and EGD was also done, he is unaware of the indication for this, and states that it was normal.  No family history of colon cancer.  He takes an Excedrin every day or every other day and is also on aspirin daily.    Past Medical History:  Diagnosis Date  . Allergy   . GERD (gastroesophageal reflux disease)   . Glaucoma   . Hyperlipidemia     Past Surgical History:  Procedure Laterality Date  . BACK SURGERY     lumbar   . ROTATOR CUFF REPAIR Left     Prior to Admission medications   Medication Sig Start Date End Date Taking? Authorizing Provider  acetaminophen  (TYLENOL) 500 MG tablet Take 500 mg by mouth every 6 (six) hours as needed.   Yes [provider]  aspirin 81 MG tablet Take 81 mg by mouth daily.   Yes [provider]  bimatoprost (LUMIGAN) 0.01 % SOLN Place 1 drop into both eyes at bedtime.   Yes [provider]  Ibuprofen-Famotidine 800-26.6 MG TABS Take 1 tablet by mouth 3 times/day as needed-between meals & bedtime.   Yes [provider]  montelukast (SINGULAIR) 10 MG tablet Take 10 mg by mouth at bedtime.   Yes [provider]  nystatin (MYCOSTATIN) 100000 UNIT/ML suspension Take 5 mLs (500,000 Units total) by mouth 4 (four) times daily. 06/23/17  Yes Timothy Fenton, NP  pantoprazole (PROTONIX) 40 MG tablet Take 1 tablet (40 mg total) by mouth daily. 07/18/17  Yes Alvarado, Timothy Keens, NP  pregabalin (LYRICA) 100 MG capsule Take 1 capsule (100 mg total) by mouth 2 (two) times daily. 08/04/17  Yes Timothy Fenton, NP  rosuvastatin (CRESTOR) 40 MG tablet Take 1 tablet (40 mg total) by mouth daily. 05/11/17  Yes Alvarado, Timothy Keens, NP  Vitamin D, Ergocalciferol, (DRISDOL) 50000 units CAPS capsule Take 1 capsule (50,000 Units total) by mouth every 7 (seven) days. 05/11/17  Yes Alvarado,  Timothy Keens, NP    Family History  Problem Relation Age of Onset  . Breast cancer Sister   . Prostate cancer Brother   . Hyperlipidemia Brother      Social History   Tobacco Use  . Smoking status: Never Smoker  . Smokeless tobacco: Never Used  Substance Use Topics  . Alcohol use: Yes    Comment: occasional beer  . Drug use: No    Allergies as of 09/09/2017 - Review Complete 09/09/2017  Allergen Reaction Noted  . Percocet [oxycodone-acetaminophen] Other (See Comments) 05/10/2017    Review of Systems:    All systems reviewed and negative except where noted in HPI.   Physical Exam:  Vital signs in last 24 hours: @VSRANGES @   General:   Pleasant, cooperative in NAD Head:  Normocephalic and atraumatic. Eyes:   No  icterus.   Conjunctiva pink. PERRLA. Ears:  Normal auditory acuity. Neck:  Supple; no masses or thyroidomegaly Lungs: Respirations even and unlabored. Lungs clear to auscultation bilaterally.   No wheezes, crackles, or rhonchi.  Heart:  Regular rate and rhythm;  Without murmur, clicks, rubs or gallops Abdomen:  Soft, nondistended, nontender. Normal bowel sounds. No appreciable masses or hepatomegaly.  No rebound or guarding.  Neurologic:  Alert and oriented x3;  grossly normal neurologically. Skin:  Intact without significant lesions or rashes. Cervical Nodes:  No significant cervical adenopathy. Psych:  Alert and cooperative. Normal affect.  LAB RESULTS: No results for input(s): WBC, HGB, HCT, PLT in the last 72 hours. BMET No results for input(s): NA, K, CL, CO2, GLUCOSE, BUN, CREATININE, CALCIUM in the last 72 hours. LFT No results for input(s): PROT, ALBUMIN, AST, ALT, ALKPHOS, BILITOT, BILIDIR, IBILI in the last 72 hours. PT/INR No results for input(s): LABPROT, INR in the last 72 hours.   CBC CMP and H. pylori serology from October 2018 is normal  STUDIES: No results found.    Impression / Plan:   [name] is a 71 y.o. y/o male with dyspepsia, GERD which is improved with once daily Protonix is here for initial visit  Given improvement with once daily PPI his symptoms are likely related to GERD In addition he has heavy NSAID use.  I have asked him to discontinue his Excedrin and take Tylenol instead for headaches or other reasons as he is also on daily aspirin. I have educated him on long-term side effects of PPI including C. difficile diarrhea, pneumonia, kidney injury.  He verbalized understanding. The goal would be to continue to encourage lifestyle modifications to control acid reflux including weight loss, elevation of the head of bed at bedtime, not eating 3 hours before bedtime and other interventions to try to decrease his PPI dose over the long-term. Since his  symptoms have improved no indication for further studies at this time Primary care provider to please send [place] records of his previous colonoscopy as interval of repeat screening/surveillance needs to be determined based on those results. H. pylori serology was negative.  Symptoms are improved.  If symptoms recur can consider stool for H. pylori testing off of PPI  His initial symptom of sore throat has completely resolved and was likely due to the URI at the time. No symptoms of food impaction or dysphagia.   I have asked him to take Gas X as needed intermittently if needed  Thank you for involving me in the care of this patient.      Timothy Manifold, MD  09/09/2017, 10:44 AM

## 2017-09-09 NOTE — Patient Instructions (Signed)
Please continue Protonix daily as directed.  Stop Excedrin OTC and take Tylenol as needed.

## 2017-09-26 ENCOUNTER — Ambulatory Visit: Payer: Self-pay | Admitting: Podiatry

## 2017-10-17 DIAGNOSIS — B351 Tinea unguium: Secondary | ICD-10-CM | POA: Diagnosis not present

## 2017-10-17 DIAGNOSIS — M79674 Pain in right toe(s): Secondary | ICD-10-CM | POA: Diagnosis not present

## 2017-10-17 DIAGNOSIS — M79675 Pain in left toe(s): Secondary | ICD-10-CM | POA: Diagnosis not present

## 2017-10-17 DIAGNOSIS — H401231 Low-tension glaucoma, bilateral, mild stage: Secondary | ICD-10-CM | POA: Diagnosis not present

## 2017-10-20 ENCOUNTER — Ambulatory Visit: Payer: Self-pay

## 2017-10-20 NOTE — Telephone Encounter (Signed)
  Reason for Disposition . [1] MODERATE pain (e.g., interferes with normal activities) AND [2] pain comes and goes (cramps) AND [3] present > 24 hours  (Exception: pain with Vomiting or Diarrhea - see that Guideline)  Answer Assessment - Initial Assessment Questions 1. LOCATION: "Where does it hurt?"      Right lower abdomin, inguinal 2. RADIATION: "Does the pain shoot anywhere else?" (e.g., chest, back)     Bottom of stomach 3. ONSET: "When did the pain begin?" (Minutes, hours or days ago)      Start 2 days ago 4. SUDDEN: "Gradual or sudden onset?"     Gradual 5. PATTERN "Does the pain come and go, or is it constant?"    - If constant: "Is it getting better, staying the same, or worsening?"      (Note: Constant means the pain never goes away completely; most serious pain is constant and it progresses)     - If intermittent: "How long does it last?" "Do you have pain now?"     (Note: Intermittent means the pain goes away completely between bouts)     Comes and goes 6. SEVERITY: "How bad is the pain?"  (e.g., Scale 1-10; mild, moderate, or severe)    - MILD (1-3): doesn't interfere with normal activities, abdomen soft and not tender to touch     - MODERATE (4-7): interferes with normal activities or awakens from sleep, tender to touch     - SEVERE (8-10): excruciating pain, doubled over, unable to do any normal activities       6-7 7. RECURRENT SYMPTOM: "Have you ever had this type of abdominal pain before?" If so, ask: "When was the last time?" and "What happened that time?"      No 8. CAUSE: "What do you think is causing the abdominal pain?"     No 9. RELIEVING/AGGRAVATING FACTORS: "What makes it better or worse?" (e.g., movement, antacids, bowel movement)     No 10. OTHER SYMPTOMS: "Has there been any vomiting, diarrhea, constipation, or urine problems?"       No  Protocols used: ABDOMINAL PAIN - MALE-A-AH Pt. Cannot have appointment today. Scheduled for Monday. Instructed pt. If  pain becomes worse to go to ED. Verbalizes understanding.

## 2017-10-24 ENCOUNTER — Ambulatory Visit: Payer: Medicare HMO | Admitting: Family Medicine

## 2017-10-24 ENCOUNTER — Emergency Department (HOSPITAL_COMMUNITY)
Admission: EM | Admit: 2017-10-24 | Discharge: 2017-10-24 | Disposition: A | Discharge status: Home / Self Care | Payer: Medicare HMO | Attending: Emergency Medicine | Admitting: Emergency Medicine

## 2017-10-24 ENCOUNTER — Emergency Department (HOSPITAL_COMMUNITY): Payer: Medicare HMO

## 2017-10-24 ENCOUNTER — Encounter (HOSPITAL_COMMUNITY): Payer: Self-pay | Admitting: Emergency Medicine

## 2017-10-24 DIAGNOSIS — N499 Inflammatory disorder of unspecified male genital organ: Secondary | ICD-10-CM | POA: Diagnosis not present

## 2017-10-24 DIAGNOSIS — R0789 Other chest pain: Secondary | ICD-10-CM | POA: Insufficient documentation

## 2017-10-24 DIAGNOSIS — M25512 Pain in left shoulder: Secondary | ICD-10-CM | POA: Diagnosis not present

## 2017-10-24 DIAGNOSIS — R001 Bradycardia, unspecified: Secondary | ICD-10-CM | POA: Diagnosis not present

## 2017-10-24 DIAGNOSIS — M545 Low back pain: Secondary | ICD-10-CM | POA: Diagnosis not present

## 2017-10-24 DIAGNOSIS — R350 Frequency of micturition: Secondary | ICD-10-CM | POA: Diagnosis not present

## 2017-10-24 DIAGNOSIS — M546 Pain in thoracic spine: Secondary | ICD-10-CM | POA: Diagnosis not present

## 2017-10-24 DIAGNOSIS — M549 Dorsalgia, unspecified: Secondary | ICD-10-CM | POA: Diagnosis present

## 2017-10-24 DIAGNOSIS — R102 Pelvic and perineal pain: Secondary | ICD-10-CM | POA: Diagnosis not present

## 2017-10-24 DIAGNOSIS — Z7982 Long term (current) use of aspirin: Secondary | ICD-10-CM | POA: Insufficient documentation

## 2017-10-24 DIAGNOSIS — Z79899 Other long term (current) drug therapy: Secondary | ICD-10-CM | POA: Diagnosis not present

## 2017-10-24 LAB — URINALYSIS, ROUTINE W REFLEX MICROSCOPIC
Bilirubin Urine: NEGATIVE
GLUCOSE, UA: NEGATIVE mg/dL
HGB URINE DIPSTICK: NEGATIVE
Ketones, ur: NEGATIVE mg/dL
LEUKOCYTES UA: NEGATIVE
Nitrite: NEGATIVE
PH: 7 (ref 5.0–8.0)
Protein, ur: NEGATIVE mg/dL
SPECIFIC GRAVITY, URINE: 1.015 (ref 1.005–1.030)

## 2017-10-24 LAB — COMPREHENSIVE METABOLIC PANEL
ALBUMIN: 3.6 g/dL (ref 3.5–5.0)
ALT: 16 U/L — AB (ref 17–63)
AST: 25 U/L (ref 15–41)
Alkaline Phosphatase: 60 U/L (ref 38–126)
Anion gap: 7 (ref 5–15)
BUN: 12 mg/dL (ref 6–20)
CHLORIDE: 106 mmol/L (ref 101–111)
CO2: 24 mmol/L (ref 22–32)
CREATININE: 1.14 mg/dL (ref 0.61–1.24)
Calcium: 8.9 mg/dL (ref 8.9–10.3)
GFR calc Af Amer: >60 mL/min (ref >60)
GLUCOSE: 98 mg/dL (ref 65–99)
POTASSIUM: 5.8 mmol/L — AB (ref 3.5–5.1)
Sodium: 137 mmol/L (ref 135–145)
Total Bilirubin: 1.5 mg/dL — ABNORMAL HIGH (ref 0.3–1.2)
Total Protein: 7.2 g/dL (ref 6.5–8.1)

## 2017-10-24 LAB — CBC
HCT: 42.6 % (ref 39.0–52.0)
Hemoglobin: 14 g/dL (ref 13.0–17.0)
MCH: 30 pg (ref 26.0–34.0)
MCHC: 32.9 g/dL (ref 30.0–36.0)
MCV: 91.4 fL (ref 78.0–100.0)
PLATELETS: 249 10*3/uL (ref 150–400)
RBC: 4.66 MIL/uL (ref 4.22–5.81)
RDW: 16.7 % — AB (ref 11.5–15.5)
WBC: 6.9 10*3/uL (ref 4.0–10.5)

## 2017-10-24 LAB — I-STAT TROPONIN, ED
TROPONIN I, POC: 0 ng/mL (ref 0.00–0.08)
Troponin i, poc: 0 ng/mL (ref 0.00–0.08)

## 2017-10-24 LAB — POTASSIUM: POTASSIUM: 4.7 mmol/L (ref 3.5–5.1)

## 2017-10-24 LAB — LIPASE, BLOOD: Lipase: 24 U/L (ref 11–51)

## 2017-10-24 MED ORDER — IOPAMIDOL (ISOVUE-370) INJECTION 76%
INTRAVENOUS | Status: AC
  Administered 2017-10-24: 100 mL
  Filled 2017-10-24: qty 100

## 2017-10-24 MED ORDER — ACETAMINOPHEN 325 MG PO TABS
650.0000 mg | ORAL_TABLET | Freq: Once | ORAL | Status: AC
  Administered 2017-10-24: 650 mg via ORAL
  Filled 2017-10-24: qty 2

## 2017-10-24 MED ORDER — ACETAMINOPHEN 500 MG PO TABS: 500.0000 mg | ORAL_TABLET | Freq: Four times a day (QID) | ORAL | 0 refills | Status: AC | PRN

## 2017-10-24 NOTE — ED Triage Notes (Signed)
Pt here with c/o left shoulder pain upper back pain that has  been ongoing , VSS

## 2017-10-24 NOTE — ED Notes (Signed)
Patient transported to X-ray 

## 2017-10-24 NOTE — ED Provider Notes (Signed)
[facility] EMERGENCY DEPARTMENT Provider Note   CSN: [id number] Arrival date & time: 10/24/17  0608     History   Chief Complaint Chief Complaint  Patient presents with  . Shoulder Pain    HPI Timothy Alvarado is a 71 y.o. male presenting with L sided back pain.  Patient states that on Saturday, he had acute onset left-sided back pain.  The pain is sharp, lasting for about a second.  It is between the shoulder blade and the spine.  When he is not having the sharp pain, he has a dull ache, worse with palpation.  He cannot bring on the sharp pain.  It is not associated with twisting or movement of the arm.  He denies history of similar.  He has tried Excedrin without improvement of pain.  He denies fall, trauma, or injury.  He denies increased activity recently.  Additionally, patient reports some soreness with palpation of the chest.  He denies fevers, chills, cough, nausea, vomiting, abdominal pain.  He reports increased urinary frequency and irritation of the R groin when he urinates.  Has blood in his urine or dysuria.  He reports normal bowel movements.  He has been evaluated by GI and diagnosed with GERD.  He is still taking Excedrin and aspirin together.  He denies recent travel, recent surgery, recent immobilization, history of clots, or history of cancer.  He denies history of ACS or CAD.  He does not smoke, denies drug use.  Reports occasional alcohol use.   HPI  Past Medical History:  Diagnosis Date  . Allergy   . GERD (gastroesophageal reflux disease)   . Glaucoma   . Hyperlipidemia     Patient Active Problem List   Diagnosis Date Noted  . Seasonal allergies 05/10/2017  . GERD (gastroesophageal reflux disease) 05/10/2017  . HLD (hyperlipidemia) 05/10/2017  . Glaucoma 05/10/2017  . Borderline diabetes 05/10/2017  . Neuropathy 05/10/2017    Past Surgical History:  Procedure Laterality Date  . BACK SURGERY     lumbar   . ROTATOR CUFF REPAIR Left         Home Medications    Prior to Admission medications   Medication Sig Start Date End Date Taking? Authorizing Provider  acetaminophen (TYLENOL) 500 MG tablet Take 500 mg by mouth every 6 (six) hours as needed.    [provider]  aspirin 81 MG tablet Take 81 mg by mouth daily.    [provider]  bimatoprost (LUMIGAN) 0.01 % SOLN Place 1 drop into both eyes at bedtime.    [provider]  Ibuprofen-Famotidine 800-26.6 MG TABS Take 1 tablet by mouth 3 times/day as needed-between meals & bedtime.    [provider]  montelukast (SINGULAIR) 10 MG tablet Take 10 mg by mouth at bedtime.    [provider]  nystatin (MYCOSTATIN) 100000 UNIT/ML suspension Take 5 mLs (500,000 Units total) by mouth 4 (four) times daily. 06/23/17   Jearld Fenton, NP  pantoprazole (PROTONIX) 40 MG tablet Take 1 tablet (40 mg total) by mouth daily. 07/18/17   Jearld Fenton, NP  pregabalin (LYRICA) 100 MG capsule Take 1 capsule (100 mg total) by mouth 2 (two) times daily. 08/04/17   Jearld Fenton, NP  rosuvastatin (CRESTOR) 40 MG tablet Take 1 tablet (40 mg total) by mouth daily. 05/11/17   Jearld Fenton, NP  Vitamin D, Ergocalciferol, (DRISDOL) 50000 units CAPS capsule Take 1 capsule (50,000 Units total) by mouth every 7 (  seven) days. 05/11/17   Jearld Fenton, NP    Family History Family History  Problem Relation Age of Onset  . Breast cancer Sister   . Prostate cancer Brother   . Hyperlipidemia Brother     Social History Social History   Tobacco Use  . Smoking status: Never Smoker  . Smokeless tobacco: Never Used  Substance Use Topics  . Alcohol use: Yes    Comment: occasional beer  . Drug use: No     Allergies   Percocet [oxycodone-acetaminophen]   Review of Systems Review of Systems  Constitutional: Negative for chills and fever.  HENT: Negative for congestion and sore throat.   Respiratory: Negative for cough, chest tightness and  shortness of breath.   Cardiovascular: Positive for chest pain (chest soreness). Negative for palpitations and leg swelling.  Gastrointestinal: Negative for abdominal pain, constipation, diarrhea, nausea and vomiting.  Genitourinary: Positive for frequency. Negative for dysuria and hematuria.  Musculoskeletal: Positive for back pain. Negative for neck pain.  Skin: Negative for wound.  Allergic/Immunologic: Negative for immunocompromised state.  Neurological: Negative for dizziness and headaches.  Hematological: Does not bruise/bleed easily.     Physical Exam Updated Vital Signs BP 129/75 (BP Location: Right Arm)   Pulse 64   Temp 97.8 F (36.6 C) (Oral)   Resp 16   SpO2 98%   Physical Exam  Constitutional: He is oriented to person, place, and time. He appears well-developed and well-nourished. No distress.  HENT:  Head: Normocephalic and atraumatic.  Eyes: EOM are normal.  Neck: Normal range of motion.  Cardiovascular: Normal rate, regular rhythm and intact distal pulses.  Pulmonary/Chest: Effort normal and breath sounds normal. No respiratory distress. He has no wheezes.     He exhibits tenderness.  Tenderness/soreness with palpation of central chest. Lung sounds clear in all fields    Abdominal: Soft. He exhibits no distension and no mass. There is no tenderness. There is no rebound and no guarding. Hernia confirmed negative in the right inguinal area and confirmed negative in the left inguinal area.  Genitourinary:     Musculoskeletal: Normal range of motion.  Lymphadenopathy: No inguinal adenopathy noted on the right or left side.  Neurological: He is alert and oriented to person, place, and time.  Skin: Skin is warm and dry.  Psychiatric: He has a normal mood and affect.  Nursing note and vitals reviewed.    ED Treatments / Results  Labs (all labs ordered are listed, but only abnormal results are displayed) Labs Reviewed - No data to display  EKG  EKG  Interpretation None       Radiology No results found.  Procedures Procedures (including critical care time)  Medications Ordered in ED Medications - No data to display   Initial Impression / Assessment and Plan / ED Course  I have reviewed the triage vital signs and the nursing notes.  Pertinent labs & imaging results that were available during my care of the patient were reviewed by me and considered in my medical decision making (see chart for details).     Resenting with back pain.  It is sharp and intermittent.  Physical exam reassuring, he is afebrile, not tachycardic, appears nontoxic.  Dull soreness is reproducible, sharp pain is not.  Will obtain basic labs for further evaluation and troponin.  EKG and chest x-ray done to rule out ACS or infection.  Tylenol for pain.  Labs show hyperkalemia possibly due to hemolysis.  Will repeat potassium.  CBC reassuring, no leukocytosis.  Troponin negative.  Chest x-ray without infection.  EKG reassuring.  Will obtain repeat troponin.  Case discussed with attending, Dr. Winfred Leeds evaluated the patient.  Dr. Winfred Leeds recommends CTA for evaluation of vessels and rule out of dissection.  Patient is agreeable to the plan.  Patient signed out to Stevphen Rochester, PA-C for follow-up on CT results and final disposition.    Final Clinical Impressions(s) / ED Diagnoses   Final diagnoses:  None    ED Discharge Orders    None       Franchot Heidelberg, PA-C 10/24/17 1545    Orlie Dakin, MD 10/24/17 [phone]

## 2017-10-24 NOTE — Discharge Instructions (Signed)
Continue taking all your home medications as prescribed.  Try to decrease your Excedrin use. Take Tylenol as needed for pain.  You may take this up to 3 times a day. Follow-up with your primary care doctor if your pain is not improving. Return to the emergency room if you develop difficulty breathing, persistent pain, or any new or worsening symptoms.

## 2017-10-24 NOTE — ED Provider Notes (Signed)
Complains of pain at the medial aspect of left scapula onset yesterday.Pain intermittent nonradiating last 5 seconds at a time no associated shortness of breath nausea or sweatiness.  No treatment prior to coming here.  No other associated symptoms on exam no distress lungs clear to auscultation heart regular rate and rhythm abdomen nondistended nontender extremities without edema. Chest x-ray reviewed by me   Orlie Dakin, MD 10/24/17 [phone]

## 2017-10-24 NOTE — ED Provider Notes (Signed)
  Physical Exam  BP (!) 141/68 (BP Location: Left Arm)   Pulse 60   Temp 97.8 F (36.6 C) (Oral)   Resp 18   SpO2 96%   Physical Exam  ED Course/Procedures     Procedures  MDM   3:37 PM Sign out from Franchot Heidelberg, PA-C  Per previous provider MDM: Resenting with back pain.  It is sharp and intermittent.  Physical exam reassuring, he is afebrile, not tachycardic, appears nontoxic.  Dull soreness is reproducible, sharp pain is not.  Will obtain basic labs for further evaluation and troponin.  EKG and chest x-ray done to rule out ACS or infection.  Tylenol for pain.  Labs show hyperkalemia possibly due to hemolysis.  Will repeat potassium.  CBC reassuring, no leukocytosis.  Troponin negative.  Chest x-ray without infection.  EKG reassuring.  Will obtain repeat troponin.  Case discussed with attending, Dr. Winfred Leeds evaluated the patient.  Pending CT Angio  3:55 PM - CT Angio unremarkable  Discussed results with patient.  At time of discharge, patient is in no acute distress. Vital Signs are stable. Patient is able to ambulate. Patient able to tolerate PO.        Shary Decamp, PA-C 10/24/17 [place], MD 10/24/17 1630

## 2017-10-26 ENCOUNTER — Telehealth: Payer: Self-pay | Admitting: *Deleted

## 2017-10-26 NOTE — Telephone Encounter (Signed)
Transition Care Management Follow-up Telephone Call   Date discharged? 10/24/2017   How have you been since you were released from the hospital? "ok"   Do you understand why you were in the hospital? yes   Do you understand the discharge instructions? yes   Where were you discharged to? home   Items Reviewed:  Medications reviewed: yes  Functional Questionnaire:   Activities of Daily Living (ADLs):   He states they are independent in the following: pt reports being independent in all areas    Any transportation issues/concerns?: no   Any patient concerns? no   Confirmed importance and date/time of follow-up visits scheduled yes  Provider Appointment booked with [facility] 11/02/2016  Confirmed with patient if condition begins to worsen call PCP or go to the ER.  Patient was given the office number and encouraged to call back with question or concerns.  : yes

## 2017-11-02 ENCOUNTER — Encounter: Payer: Self-pay | Admitting: Internal Medicine

## 2017-11-02 ENCOUNTER — Ambulatory Visit: Payer: Medicare HMO | Admitting: Internal Medicine

## 2017-11-02 VITALS — BP 136/72 | HR 71 | Temp 98.2°F | Wt 261.5 lb

## 2017-11-02 DIAGNOSIS — R1031 Right lower quadrant pain: Secondary | ICD-10-CM

## 2017-11-02 DIAGNOSIS — R1030 Lower abdominal pain, unspecified: Secondary | ICD-10-CM

## 2017-11-02 DIAGNOSIS — R300 Dysuria: Secondary | ICD-10-CM | POA: Diagnosis not present

## 2017-11-02 DIAGNOSIS — M546 Pain in thoracic spine: Secondary | ICD-10-CM | POA: Diagnosis not present

## 2017-11-02 DIAGNOSIS — R1032 Left lower quadrant pain: Secondary | ICD-10-CM

## 2017-11-02 LAB — POC URINALSYSI DIPSTICK (AUTOMATED)
BILIRUBIN UA: NEGATIVE
Blood, UA: NEGATIVE
Glucose, UA: NEGATIVE
KETONES UA: NEGATIVE
LEUKOCYTES UA: NEGATIVE
Nitrite, UA: NEGATIVE
Protein, UA: NEGATIVE
Urobilinogen, UA: 0.2 E.U./dL
pH, UA: 6 (ref 5.0–8.0)

## 2017-11-02 NOTE — Patient Instructions (Signed)

## 2017-11-02 NOTE — Progress Notes (Signed)
Subjective:    Patient ID: Timothy Alvarado, male    DOB: 09/22/1946, 72 y.o.   MRN: [id number]  HPI  Pt presents to the clinic today for TCM ER Follow Up. He went to the ER 12/24 with c/o left sided back pain. He described the pain as sharp and stabbing at times, dull and achy at other times. The pain did not radiate. The pain was worse with palpation. Movement did not make the pain worse. He denied chest pain, chest tightness or shortness of breath. He does have a history of GERD, but reports this felt different. ECG showed sinus bradycardia. Chest xray was negative. CT angio chest showed an incidental finding of a cyst under the left lobe of the liver but no PE. He was diagnosed with acute back pain, treated with Tylenol. Since discharge, he denies any shoulder pain.  He also c/o bilateral groin pain. He reports this started 2 weeks ago. He describes the pain as sharp and stabbing. The pain is intermittent and only lasts for a couple of seconds. The pain is initiated with urination or lifting his leg. He does have some associated dysuria, but denies urgency, frequency, low back pain, nausea, fever or chills. He has tried drinking cranberry juice. He is not sexually active.   Review of Systems  Past Medical History:  Diagnosis Date  . Allergy   . GERD (gastroesophageal reflux disease)   . Glaucoma   . Hyperlipidemia     Current Outpatient Medications  Medication Sig Dispense Refill  . acetaminophen (TYLENOL) 500 MG tablet Take 500 mg by mouth every 6 (six) hours as needed for mild pain.     Marland Kitchen acetaminophen (TYLENOL) 500 MG tablet Take 1 tablet (500 mg total) by mouth every 6 (six) hours as needed. 30 tablet 0  . aspirin 81 MG tablet Take 81 mg by mouth daily as needed for pain.     Marland Kitchen aspirin-acetaminophen-caffeine (EXCEDRIN MIGRAINE) 250-250-65 MG tablet Take 2 tablets by mouth every 6 (six) hours as needed for headache.    . bimatoprost (LUMIGAN) 0.01 % SOLN Place 1 drop into both eyes at  bedtime.    Marland Kitchen nystatin (MYCOSTATIN) 100000 UNIT/ML suspension Take 5 mLs (500,000 Units total) by mouth 4 (four) times daily. (Patient not taking: Reported on 10/24/2017) 120 mL 0  . pantoprazole (PROTONIX) 40 MG tablet Take 1 tablet (40 mg total) by mouth daily. 30 tablet 2  . pregabalin (LYRICA) 100 MG capsule Take 1 capsule (100 mg total) by mouth 2 (two) times daily. (Patient taking differently: Take 100 mg by mouth daily as needed (diabetic nerve pain). ) 60 capsule 0  . ranitidine (ZANTAC) 150 MG tablet Take 150 mg by mouth daily as needed for heartburn.    . rosuvastatin (CRESTOR) 40 MG tablet Take 1 tablet (40 mg total) by mouth daily. 30 tablet 2  . vitamin B-12 (CYANOCOBALAMIN) 100 MCG tablet Take 100 mcg by mouth daily.    . Vitamin D, Ergocalciferol, (DRISDOL) 50000 units CAPS capsule Take 1 capsule (50,000 Units total) by mouth every 7 (seven) days. (Patient not taking: Reported on 10/24/2017) 12 capsule 0   No current facility-administered medications for this visit.     Allergies  Allergen Reactions  . Percocet [Oxycodone-Acetaminophen] Other (See Comments)    hallucinations    Family History  Problem Relation Age of Onset  . Breast cancer Sister   . Prostate cancer Brother   . Hyperlipidemia Brother     Social History  Socioeconomic History  . Marital status: Married    Spouse name: Not on file  . Number of children: Not on file  . Years of education: Not on file  . Highest education level: Not on file  Social Needs  . Financial resource strain: Not on file  . Food insecurity - worry: Not on file  . Food insecurity - inability: Not on file  . Transportation needs - medical: Not on file  . Transportation needs - non-medical: Not on file  Occupational History  . Not on file  Tobacco Use  . Smoking status: Never Smoker  . Smokeless tobacco: Never Used  Substance and Sexual Activity  . Alcohol use: Yes    Comment: occasional beer  . Drug use: No  . Sexual  activity: No  Other Topics Concern  . Not on file  Social History Narrative  . Not on file     Constitutional: Denies fever, malaise, fatigue, headache or abrupt weight changes.  Respiratory: Denies difficulty breathing, shortness of breath, cough or sputum production.   Cardiovascular: Denies chest pain, chest tightness, palpitations or swelling in the hands or feet.  Gastrointestinal: Denies abdominal pain, bloating, constipation, diarrhea or blood in the stool.  GU: Pt reports dysuria. Denies urgency, frequency, pain with urination, burning sensation, blood in urine, odor or discharge. Musculoskeletal: Pt reports bilateral groin pain. Denies decrease in range of motion, difficulty with gait, muscle pain or joint pain and swelling.    No other specific complaints in a complete review of systems (except as listed in HPI above).     Objective:   Physical Exam  BP 136/72   Pulse 71   Temp 98.2 F (36.8 C) (Oral)   Wt 261 lb 8 oz (118.6 kg)   SpO2 95%   BMI 32.69 kg/m  Wt Readings from Last 3 Encounters:  11/02/17 261 lb 8 oz (118.6 kg)  09/09/17 253 lb 3.2 oz (114.9 kg)  08/08/17 251 lb 8 oz (114.1 kg)    General: Appears his stated age, in NAD. Skin: Warm, dry and intact. No rashes noted. Cardiovascular: Normal rate and rhythm. S1,S2 noted.  No murmur, rubs or gallops noted.  Pulmonary/Chest: Normal effort and positive vesicular breath sounds. No respiratory distress. No wheezes, rales or ronchi noted.  Abdomen: Soft and nontender. Normal bowel sounds. No distention or masses noted. No CVA tenderness noted Musculoskeletal: Normal flexion, extension, internal and external rotation of bilateral hips. No pain with palpation of the groins. No masses noted. Inguinal hernia on the left noted on recent CT scan. Normal flexion, extension and rotation of the spine. No bony tenderness noted over the spine. Neurological: Alert and oriented.    BMET    Component Value Date/Time    NA 137 10/24/2017 0910   K 4.7 10/24/2017 1258   CL 106 10/24/2017 0910   CO2 24 10/24/2017 0910   GLUCOSE 98 10/24/2017 0910   BUN 12 10/24/2017 0910   CREATININE 1.14 10/24/2017 0910   CALCIUM 8.9 10/24/2017 0910   GFRNONAA >60 10/24/2017 0910   GFRAA >60 10/24/2017 0910    Lipid Panel     Component Value Date/Time   CHOL 157 08/08/2017 0859   TRIG 79.0 08/08/2017 0859   HDL 46.70 08/08/2017 0859   CHOLHDL 3 08/08/2017 0859   VLDL 15.8 08/08/2017 0859   LDLCALC 94 08/08/2017 0859    CBC    Component Value Date/Time   WBC 6.9 10/24/2017 0910   RBC 4.66 10/24/2017 0910  HGB 14.0 10/24/2017 0910   HCT 42.6 10/24/2017 0910   PLT 249 10/24/2017 0910   MCV 91.4 10/24/2017 0910   MCH 30.0 10/24/2017 0910   MCHC 32.9 10/24/2017 0910   RDW 16.7 (H) 10/24/2017 0910    Hgb A1C No results found for: HGBA1C          Assessment & Plan:   TCM ER Follow Up for Acute Back Pain:  ER notes, imaging and labs reviewed Back pain improved with Tylenol No further intervention needed at this time  Bilateral Groin Pain, Dysuria:  Urinalysis: normal No need for urine culture Push fluids, if symptoms persist let me know  Make an appt for your annual exam Webb Silversmith, NP

## 2017-12-05 ENCOUNTER — Ambulatory Visit: Payer: Self-pay | Admitting: *Deleted

## 2017-12-05 ENCOUNTER — Encounter: Payer: Self-pay | Admitting: Family Medicine

## 2017-12-05 ENCOUNTER — Other Ambulatory Visit: Payer: Self-pay

## 2017-12-05 ENCOUNTER — Ambulatory Visit: Payer: Medicare HMO | Admitting: Family Medicine

## 2017-12-05 VITALS — BP 100/60 | HR 68 | Temp 98.6°F | Ht 75.0 in | Wt 264.8 lb

## 2017-12-05 DIAGNOSIS — N410 Acute prostatitis: Secondary | ICD-10-CM

## 2017-12-05 DIAGNOSIS — R350 Frequency of micturition: Secondary | ICD-10-CM | POA: Diagnosis not present

## 2017-12-05 MED ORDER — CIPROFLOXACIN HCL 500 MG PO TABS: 500.0000 mg | ORAL_TABLET | Freq: Two times a day (BID) | ORAL | 0 refills | Status: DC

## 2017-12-05 NOTE — Progress Notes (Signed)
Dr. Frederico Hamman T. Bruce Mayers, MD, [facility] [place], [address] Phone: [phone] Fax: [phone]  12/05/2017  Patient: Timothy Alvarado, MRN: [id number], DOB: 06-08-1946, 72 y.o.  Primary Physician:  Jearld Fenton, NP   Chief Complaint  Patient presents with  . Groin Pain    ?Hernia   Subjective:   Timothy Alvarado is a 72 y.o. very pleasant male patient who presents with the following:  Prostate region pain. No bulge, no trauma. No STD exposure, no penile discharge. He does have some increased urination during the day and night, but that is longstanding and he is 72. There is no recent PSA. This has been ongoing since Friday.  Check psa.   Past Medical History, Surgical History, Social History, Family History, Problem List, Medications, and Allergies have been reviewed and updated if relevant.  Patient Active Problem List   Diagnosis Date Noted  . Seasonal allergies 05/10/2017  . GERD (gastroesophageal reflux disease) 05/10/2017  . HLD (hyperlipidemia) 05/10/2017  . Glaucoma 05/10/2017  . Borderline diabetes 05/10/2017  . Neuropathy 05/10/2017    Past Medical History:  Diagnosis Date  . Allergy   . GERD (gastroesophageal reflux disease)   . Glaucoma   . Hyperlipidemia     Past Surgical History:  Procedure Laterality Date  . BACK SURGERY     lumbar   . ROTATOR CUFF REPAIR Left     Social History   Socioeconomic History  . Marital status: Married    Spouse name: Not on file  . Number of children: Not on file  . Years of education: Not on file  . Highest education level: Not on file  Social Needs  . Financial resource strain: Not on file  . Food insecurity - worry: Not on file  . Food insecurity - inability: Not on file  . Transportation needs - medical: Not on file  . Transportation needs - non-medical: Not on file  Occupational History  . Not on file  Tobacco Use  . Smoking status: Never Smoker  .  Smokeless tobacco: Never Used  Substance and Sexual Activity  . Alcohol use: Yes    Comment: occasional beer  . Drug use: No  . Sexual activity: No  Other Topics Concern  . Not on file  Social History Narrative  . Not on file    Family History  Problem Relation Age of Onset  . Breast cancer Sister   . Prostate cancer Brother   . Hyperlipidemia Brother     Allergies  Allergen Reactions  . Percocet [Oxycodone-Acetaminophen] Other (See Comments)    hallucinations    Medication list reviewed and updated in full in [facility].   GEN: No acute illnesses, no fevers, chills. GI: No n/v/d, eating normally Pulm: No SOB Interactive and getting along well at home.  Otherwise, ROS is as per the HPI.  Objective:   BP 100/60   Pulse 68   Temp 98.6 F (37 C) (Oral)   Ht 6\' 3"  (1.905 m)   Wt 264 lb 12 oz (120.1 kg)   BMI 33.09 kg/m   GEN: WDWN, NAD, Non-toxic, A & O x 3 HEENT: Atraumatic, Normocephalic. Neck supple. No masses, No LAD. Ears and Nose: No external deformity. CV: RRR, No M/G/R. No JVD. No thrill. No extra heart sounds. PULM: CTA B, no wheezes, crackles, rhonchi. No retractions. No resp. distress. No accessory muscle use. EXTR: No c/c/e NEURO Normal gait.  PSYCH: Normally interactive. Conversant. Not depressed or anxious appearing.  Calm demeanor.   GU: normal male. Testicular exam is normal and no hernia.   Laboratory and Imaging Data:  Assessment and Plan:   Prostatitis, acute  Increased frequency of urination    3-4 d onset of acute prostatitis  Increased urination - with risk factors, get a PSA - but wait until infection clears.He has a CPX in a few weeks.  Follow-up: No Follow-up on file.  Meds ordered this encounter  Medications  . ciprofloxacin (CIPRO) 500 MG tablet    Sig: Take 1 tablet (500 mg total) by mouth 2 (two) times daily.    Dispense:  60 tablet    Refill:  0   Signed,  Dorr Perrot T. Charlie Char, MD   Allergies as of  12/05/2017      Reactions   Percocet [oxycodone-acetaminophen] Other (See Comments)   hallucinations      Medication List        Accurate as of 12/05/17  4:46 PM. Always use your most recent med list.          acetaminophen 500 MG tablet Commonly known as:  TYLENOL Take 1 tablet (500 mg total) by mouth every 6 (six) hours as needed.   aspirin 81 MG tablet Take 81 mg by mouth daily as needed for pain.   aspirin-acetaminophen-caffeine 250-250-65 MG tablet Commonly known as:  EXCEDRIN MIGRAINE Take 2 tablets by mouth every 6 (six) hours as needed for headache.   bimatoprost 0.01 % Soln Commonly known as:  LUMIGAN Place 1 drop into both eyes at bedtime.   ciprofloxacin 500 MG tablet Commonly known as:  CIPRO Take 1 tablet (500 mg total) by mouth 2 (two) times daily.   pantoprazole 40 MG tablet Commonly known as:  PROTONIX Take 1 tablet (40 mg total) by mouth daily.   pregabalin 100 MG capsule Commonly known as:  LYRICA Take 1 capsule (100 mg total) by mouth 2 (two) times daily.   ranitidine 150 MG tablet Commonly known as:  ZANTAC Take 150 mg by mouth daily as needed for heartburn.   rosuvastatin 40 MG tablet Commonly known as:  CRESTOR Take 1 tablet (40 mg total) by mouth daily.   vitamin B-12 100 MCG tablet Commonly known as:  CYANOCOBALAMIN Take 100 mcg by mouth daily.   Vitamin D (Ergocalciferol) 50000 units Caps capsule Commonly known as:  DRISDOL Take 1 capsule (50,000 Units total) by mouth every 7 (seven) days.

## 2017-12-05 NOTE — Telephone Encounter (Signed)
Pt states he has burning type pain in both groins, esp when he tries to walk. He has taken Tylenol and it does help some. He is wondering if he needs to be seen by a urologist.  Appointment made for tomorrow. Home care advice given to patient.  Reason for Disposition . [1] Brief pain in scrotum or testicle AND [2] present < 1 hour AND [3] recurrent  (NO swelling)  Answer Assessment - Initial Assessment Questions 1. LOCATION and RADIATION: "Where is the pain located?"      Burning on  Left and right groin and scrotum 2. QUALITY: "What does the pain feel like?"  (e.g., sharp, dull, aching, burning)     burning 3. SEVERITY: "How bad is the pain?"  (Scale 1-10; or mild, moderate, severe)   - MILD (1-3): doesn't interfere with normal activities    - MODERATE (4-7): interferes with normal activities (e.g., work or school) or awakens from sleep   - SEVERE (8-10): excruciating pain, unable to do any normal activities, difficulty walking     mild 4. ONSET: "When did the pain start?"     The scrotum today but in the groin over the weekend 5. PATTERN: "Does it come and go, or has it been constant since it started?"     Comes and goes 6. SCROTAL APPEARANCE: "What does the scrotum look like?" "Is there any swelling or redness?"      No swelling or redness 7. HERNIA: "Has a doctor ever told you that you have a hernia?"     no 8. OTHER SYMPTOMS: "Do you have any other symptoms?" (e.g., fever, abdominal pain, vomiting, difficulty passing urine)     no  Protocols used: SCROTAL PAIN-A-AH

## 2017-12-26 ENCOUNTER — Ambulatory Visit (INDEPENDENT_AMBULATORY_CARE_PROVIDER_SITE_OTHER): Payer: Medicare HMO | Admitting: Internal Medicine

## 2017-12-26 ENCOUNTER — Encounter: Payer: Self-pay | Admitting: Internal Medicine

## 2017-12-26 VITALS — BP 110/70 | HR 72 | Temp 97.9°F | Ht 73.0 in | Wt 270.4 lb

## 2017-12-26 DIAGNOSIS — Z1159 Encounter for screening for other viral diseases: Secondary | ICD-10-CM | POA: Diagnosis not present

## 2017-12-26 DIAGNOSIS — R7303 Prediabetes: Secondary | ICD-10-CM

## 2017-12-26 DIAGNOSIS — R351 Nocturia: Secondary | ICD-10-CM | POA: Diagnosis not present

## 2017-12-26 DIAGNOSIS — K219 Gastro-esophageal reflux disease without esophagitis: Secondary | ICD-10-CM

## 2017-12-26 DIAGNOSIS — E7800 Pure hypercholesterolemia, unspecified: Secondary | ICD-10-CM | POA: Diagnosis not present

## 2017-12-26 DIAGNOSIS — R102 Pelvic and perineal pain: Secondary | ICD-10-CM | POA: Diagnosis not present

## 2017-12-26 DIAGNOSIS — Z125 Encounter for screening for malignant neoplasm of prostate: Secondary | ICD-10-CM

## 2017-12-26 DIAGNOSIS — J302 Other seasonal allergic rhinitis: Secondary | ICD-10-CM

## 2017-12-26 DIAGNOSIS — G479 Sleep disorder, unspecified: Secondary | ICD-10-CM

## 2017-12-26 DIAGNOSIS — G629 Polyneuropathy, unspecified: Secondary | ICD-10-CM | POA: Diagnosis not present

## 2017-12-26 DIAGNOSIS — Z0000 Encounter for general adult medical examination without abnormal findings: Secondary | ICD-10-CM

## 2017-12-26 LAB — POCT URINALYSIS DIPSTICK
Bilirubin, UA: NEGATIVE
Blood, UA: NEGATIVE
Glucose, UA: NEGATIVE
KETONES UA: NEGATIVE
Leukocytes, UA: NEGATIVE
NITRITE UA: NEGATIVE
PH UA: 6 (ref 5.0–8.0)
PROTEIN UA: NEGATIVE
UROBILINOGEN UA: NEGATIVE U/dL — AB

## 2017-12-26 LAB — CBC
HEMATOCRIT: 41.2 % (ref 39.0–52.0)
Hemoglobin: 13.7 g/dL (ref 13.0–17.0)
MCHC: 33.2 g/dL (ref 30.0–36.0)
MCV: 90.8 fl (ref 78.0–100.0)
Platelets: 265 10*3/uL (ref 150.0–400.0)
RBC: 4.54 Mil/uL (ref 4.22–5.81)
RDW: 16.1 % — ABNORMAL HIGH (ref 11.5–15.5)
WBC: 5.6 10*3/uL (ref 4.0–10.5)

## 2017-12-26 LAB — COMPREHENSIVE METABOLIC PANEL
ALBUMIN: 3.9 g/dL (ref 3.5–5.2)
ALK PHOS: 60 U/L (ref 39–117)
ALT: 30 U/L (ref 0–53)
AST: 22 U/L (ref 0–37)
BILIRUBIN TOTAL: 0.7 mg/dL (ref 0.2–1.2)
BUN: 15 mg/dL (ref 6–23)
CO2: 29 mEq/L (ref 19–32)
CREATININE: 1.18 mg/dL (ref 0.40–1.50)
Calcium: 9.3 mg/dL (ref 8.4–10.5)
Chloride: 103 mEq/L (ref 96–112)
GFR: 78.56 mL/min (ref >60.00)
Glucose, Bld: 103 mg/dL — ABNORMAL HIGH (ref 70–99)
POTASSIUM: 4 meq/L (ref 3.5–5.1)
SODIUM: 139 meq/L (ref 135–145)
TOTAL PROTEIN: 7.1 g/dL (ref 6.0–8.3)

## 2017-12-26 LAB — LIPID PANEL
CHOLESTEROL: 318 mg/dL — AB (ref 0–200)
HDL: 46.9 mg/dL (ref >39.00)
LDL Cholesterol: 256 mg/dL — ABNORMAL HIGH (ref 0–99)
NonHDL: 270.72
Total CHOL/HDL Ratio: 7
Triglycerides: 74 mg/dL (ref 0.0–149.0)
VLDL: 14.8 mg/dL (ref 0.0–40.0)

## 2017-12-26 LAB — PSA, MEDICARE: PSA: 7.06 ng/mL — AB (ref 0.10–4.00)

## 2017-12-26 LAB — HEMOGLOBIN A1C: HEMOGLOBIN A1C: 6.3 % (ref 4.6–6.5)

## 2017-12-26 NOTE — Assessment & Plan Note (Signed)
Controlled with as needed Lyrica Will monitor for now

## 2017-12-26 NOTE — Assessment & Plan Note (Signed)
Continue Zyrtec prn.

## 2017-12-26 NOTE — Assessment & Plan Note (Signed)
CMET and Lipid Profile today Encouraged him to consume a low fat diet Continue Rosuvastatin, will refill after labs are back

## 2017-12-26 NOTE — Progress Notes (Signed)
HPI:  Pt presents to the clinic today for his Medicare Wellness Exam. He is also due to follow up chronic conditions.   Borderline Diabetes with Neuropathy: There is no A1C on file. He takes Lyrica as needed for his neuropathic pain with good relief.   GERD: Triggered by spicy foods. He is taking Pantoprazole and Ranitidine as needed. He denies breakthrough sympotms.  HLD: His last LDL was 94, 08/2017. He is taking Rosuvastatin as prescribed, although he ran out 1 week ago. He tries to consume a low fat diet.  Seasonal Allergies: Worse in the spring. He takes Zyrtec as needed with good relief.  He also reports perineal pain. He reports this started 1 month ago. He denies urgency, frequency or dysuria. He does have some nocturia. He was treated in the last year for a bout of acute prostatitis. He has had an elevated PSA in the past, but evaluated by urology and was told everything was fine.   He also reports difficulty staying asleep. This has been going on for years, but seems worse in the last few months. He has no trouble falling asleep. He can only sleep 3-4 hours before he wakes up and then he will just lay in bed until it is time to get up. He does feel tired throughout the day. He does sometime nap during the day. He sleeps alone, so not sure if he snores. He has never been told that he has sleep apnea.   Past Medical History:  Diagnosis Date  . Allergy   . GERD (gastroesophageal reflux disease)   . Glaucoma   . Hyperlipidemia     Current Outpatient Medications  Medication Sig Dispense Refill  . acetaminophen (TYLENOL) 500 MG tablet Take 1 tablet (500 mg total) by mouth every 6 (six) hours as needed. 30 tablet 0  . aspirin 81 MG tablet Take 81 mg by mouth daily as needed for pain.     Marland Kitchen aspirin-acetaminophen-caffeine (EXCEDRIN MIGRAINE) 250-250-65 MG tablet Take 2 tablets by mouth every 6 (six) hours as needed for headache.    . bimatoprost (LUMIGAN) 0.01 % SOLN Place 1 drop into  both eyes at bedtime.    . ciprofloxacin (CIPRO) 500 MG tablet Take 1 tablet (500 mg total) by mouth 2 (two) times daily. 60 tablet 0  . pantoprazole (PROTONIX) 40 MG tablet Take 1 tablet (40 mg total) by mouth daily. 30 tablet 2  . pregabalin (LYRICA) 100 MG capsule Take 1 capsule (100 mg total) by mouth 2 (two) times daily. (Patient taking differently: Take 100 mg by mouth daily as needed (diabetic nerve pain). ) 60 capsule 0  . ranitidine (ZANTAC) 150 MG tablet Take 150 mg by mouth daily as needed for heartburn.    . rosuvastatin (CRESTOR) 40 MG tablet Take 1 tablet (40 mg total) by mouth daily. 30 tablet 2  . vitamin B-12 (CYANOCOBALAMIN) 100 MCG tablet Take 100 mcg by mouth daily.    . Vitamin D, Ergocalciferol, (DRISDOL) 50000 units CAPS capsule Take 1 capsule (50,000 Units total) by mouth every 7 (seven) days. 12 capsule 0   No current facility-administered medications for this visit.     Allergies  Allergen Reactions  . Percocet [Oxycodone-Acetaminophen] Other (See Comments)    hallucinations    Family History  Problem Relation Age of Onset  . Breast cancer Sister   . Prostate cancer Brother   . Hyperlipidemia Brother     Social History   Socioeconomic History  . Marital status:  Married    Spouse name: Not on file  . Number of children: Not on file  . Years of education: Not on file  . Highest education level: Not on file  Social Needs  . Financial resource strain: Not on file  . Food insecurity - worry: Not on file  . Food insecurity - inability: Not on file  . Transportation needs - medical: Not on file  . Transportation needs - non-medical: Not on file  Occupational History  . Not on file  Tobacco Use  . Smoking status: Never Smoker  . Smokeless tobacco: Never Used  Substance and Sexual Activity  . Alcohol use: Yes    Comment: occasional beer  . Drug use: No  . Sexual activity: No  Other Topics Concern  . Not on file  Social History Narrative  . Not on  file    Hospitiliaztions: None  Health Maintenance:    Flu: 08/2017  Tetanus: < 10 years  Pneumovax: unsure  Prevnar: unsure  Zostavax: unsure  PSA: unsure  Colon Screening: about 10 years ago  Eye Doctor: annually, now wearing glasses  Dental Exam: as needed   Providers:   PCP: Webb Silversmith, NP-C    I have personally reviewed and have noted:  1. The patient's medical and social history 2. Their use of alcohol, tobacco or illicit drugs 3. Their current medications and supplements 4. The patient's functional ability including ADL's, fall risks, home safety risks and  hearing or visual impairment. 5. Diet and physical activities 6. Evidence for depression or mood disorder  Subjective:   Review of Systems:   Constitutional: Denies fever, malaise, fatigue, headache or abrupt weight changes.  HEENT: Denies eye pain, eye redness, ear pain, ringing in the ears, wax buildup, runny nose, nasal congestion, bloody nose, or sore throat. Respiratory: Denies difficulty breathing, shortness of breath, cough or sputum production.   Cardiovascular: Denies chest pain, chest tightness, palpitations or swelling in the hands or feet.  Gastrointestinal: Denies abdominal pain, bloating, constipation, diarrhea or blood in the stool.  GU: Pt reports perineal pain, nocturia. Denies urgency, frequency, pain with urination, burning sensation, blood in urine, odor or discharge. Musculoskeletal: Denies decrease in range of motion, difficulty with gait, muscle pain or joint pain and swelling.  Skin: Denies redness, rashes, lesions or ulcercations.  Neurological: Pt reports insomnia. Denies dizziness, difficulty with memory, difficulty with speech or problems with balance and coordination.  Psych: Denies anxiety, depression, SI/HI.  No other specific complaints in a complete review of systems (except as listed in HPI above).  Objective:  PE:   BP 110/70 (BP Location: Left Arm, Patient Position:  Sitting, Cuff Size: Large)   Pulse 72   Temp 97.9 F (36.6 C) (Oral)   Ht 6\' 1"  (1.854 m)   Wt 270 lb 6.4 oz (122.7 kg)   SpO2 95%   BMI 35.67 kg/m   Wt Readings from Last 3 Encounters:  12/05/17 264 lb 12 oz (120.1 kg)  11/02/17 261 lb 8 oz (118.6 kg)  09/09/17 253 lb 3.2 oz (114.9 kg)    General: Appears his stated age, obese in NAD. Skin: Warm, dry and intact.  HEENT: Head: normal shape and size; Eyes: sclera white, no icterus, conjunctiva pink, PERRLA and EOMs intact; Ears: Tm's gray and intact, normal light reflex; Throat/Mouth: Teeth present, mucosa pink and moist, no exudate, lesions or ulcerations noted.  Neck: Neck supple, trachea midline. No masses, lumps or thyromegaly present.  Cardiovascular: Normal rate and  rhythm. S1,S2 noted.  No murmur, rubs or gallops noted. No JVD or BLE edema. No carotid bruits noted. Pulmonary/Chest: Normal effort and positive vesicular breath sounds. No respiratory distress. No wheezes, rales or ronchi noted.  Abdomen: Soft and nontender. Normal bowel sounds.  Rectal: Normal rectal tone. Prostate slightly enlarged, nontender. Musculoskeletal: Strength 5/5 BUE/BLE. No signs of joint swelling.  Neurological: Alert and oriented.  Psychiatric: Mood and affect normal. Behavior is normal. Judgment and thought content normal.    BMET    Component Value Date/Time   NA 137 10/24/2017 0910   K 4.7 10/24/2017 1258   CL 106 10/24/2017 0910   CO2 24 10/24/2017 0910   GLUCOSE 98 10/24/2017 0910   BUN 12 10/24/2017 0910   CREATININE 1.14 10/24/2017 0910   CALCIUM 8.9 10/24/2017 0910   GFRNONAA >60 10/24/2017 0910   GFRAA >60 10/24/2017 0910    Lipid Panel     Component Value Date/Time   CHOL 157 08/08/2017 0859   TRIG 79.0 08/08/2017 0859   HDL 46.70 08/08/2017 0859   CHOLHDL 3 08/08/2017 0859   VLDL 15.8 08/08/2017 0859   LDLCALC 94 08/08/2017 0859    CBC    Component Value Date/Time   WBC 6.9 10/24/2017 0910   RBC 4.66 10/24/2017  0910   HGB 14.0 10/24/2017 0910   HCT 42.6 10/24/2017 0910   PLT 249 10/24/2017 0910   MCV 91.4 10/24/2017 0910   MCH 30.0 10/24/2017 0910   MCHC 32.9 10/24/2017 0910   RDW 16.7 (H) 10/24/2017 0910    Hgb A1C No results found for: HGBA1C    Assessment and Plan:   Medicare Annual Wellness Visit:  Diet: He eats mainly lean meat. He consumes lots of fruits and veggies. He tries to avoid fried foods. He drink mostly water and juice. Physical activity: None Depression/mood screen: Negative Hearing: Intact to whispered voice Visual acuity: Grossly normal, performs annual eye exam  ADLs: Capable Fall risk: None Home safety: Good Cognitive evaluation: Intact to orientation, naming, recall and repetition EOL planning: No adv directives, full code/ I agree  Preventative Medicine: Flu shot UTD. He declines tetanus, pneumovax, prevnar or shingrix. Will request copy of last colonoscopy, to see when he is due again. Encouraged him to consume a balanced diet and exercise regimen. Advised him to see an eye doctor annually. Will check CBC, CMET, Lipid, A1C, PSA and Hep C today.  Perineal Pain, Nocturia:  Prostate exam today PSA today May need referral to urology  Sleep Disturbance:  He is not interested in taking any more medications Will refer to pulmonology to r/o sleep apena  Next appointment: 1 year, Medicare Wellness Exam   Webb Silversmith, NP

## 2017-12-26 NOTE — Assessment & Plan Note (Signed)
Encouraged weight loss and avoid spicy foods Advised him Pantoprazole is not meant to take prn, just use the Ranitidine prn CBC and CMET today

## 2017-12-26 NOTE — Assessment & Plan Note (Signed)
A1C today Discussed the importance of low carb diet and exercise for weight loss Encouraged yearly eye exams Foot exam today

## 2017-12-26 NOTE — Patient Instructions (Signed)

## 2017-12-26 NOTE — Addendum Note (Signed)
Addended by: Jearld Fenton on: 12/26/2017 10:24 AM   Modules accepted: Orders

## 2017-12-27 LAB — HEPATITIS C ANTIBODY
HEP C AB: NONREACTIVE
SIGNAL TO CUT-OFF: 0.02 (ref <1.00)

## 2017-12-28 MED ORDER — ROSUVASTATIN CALCIUM 40 MG PO TABS: 40.0000 mg | ORAL_TABLET | Freq: Every day | ORAL | 2 refills | Status: DC

## 2017-12-28 NOTE — Addendum Note (Signed)
Addended by: Lurlean Nanny on: 12/28/2017 11:58 AM   Modules accepted: Orders

## 2018-01-04 ENCOUNTER — Telehealth: Payer: Self-pay | Admitting: Internal Medicine

## 2018-01-04 DIAGNOSIS — R972 Elevated prostate specific antigen [PSA]: Secondary | ICD-10-CM

## 2018-01-04 NOTE — Telephone Encounter (Signed)
Copied from [place] [id number]. Topic: Referral - Status >> Jan 04, 2018  9:29 AM Robina Ade, Helene Kelp D wrote: Reason for CRM: Patient call and said that he wants to talk to [facility] or Rosaria Ferries about his urology referral. Please call patient back, thanks.

## 2018-01-04 NOTE — Addendum Note (Signed)
Addended by: Jearld Fenton on: 01/04/2018 03:02 PM   Modules accepted: Orders

## 2018-01-04 NOTE — Telephone Encounter (Signed)
I called patient about his Pulmonology referral and also discussed urology referral. He wants to proceed with urology referral for PSA level. Please place a referral order for urology and I will work on this. Thank Edrick Kins, RMA

## 2018-01-04 NOTE — Telephone Encounter (Signed)
Spoke with patient about this and taking care of this-Anastasiya V Hopkins, RMA

## 2018-01-04 NOTE — Telephone Encounter (Signed)
Referral placed.

## 2018-01-05 ENCOUNTER — Telehealth: Payer: Self-pay

## 2018-01-05 NOTE — Telephone Encounter (Signed)
Patient walked in complaining to receptionist: Tightness in Chest, Leg pain and Headache.  Patient brought back to exam room and triage assessment performed by this writer with the following results:  CC: patient reports today B leg stiffness and weakness (worse on R), with a slight Headache.  He denies any current chest discomfort. States he came in because he was concerned that he was having a heart attach or stroke. Sx's mostly occur at night and intermittently X past 2 days.   VS:  108/68, 80 and regular, 97.9, o2 Sat 95% on RA  Lungs Clear on Auscultation - however patient states feeling congestion in upper airway/throat with prod cough (yellow sputum) mostly in am.  NO SOB  Cardiovascular-  Rate/Rhythm WNL, no signs of edema generalized or to BLE, LE pulses present an equal. No chest pain.  Has had hx of "mild" chest tightness to center and left chest intermittently over past 2 days but not at the present.   Neuro - neuro checks all WNL, grips/strength bilaterally equal to upper and lower limbs, tactile sensatory reaction to face, arms, feet all WNL, denies any numbness and tingling, speech is clear and facial movements/expression WNL without any changes in vision.  He denies any dizziness or lightheadedness  Headache - is mild (4 on 0-10 pain scale).  Described as a generalized location not to a specific area of the head and not sharp, stabbing with aura's or nausea.    Bilateral leg "stiffness" - reported worse on the right side mostly centered to back of upper thigh.  Rate (4) on 0-10 pain scale.  Is taking Tylenol which helps.    Neg s/s for DVT with negative Homans bilaterally.  (*he did report tightness in his right calf, but no pain or tenderness).  Left thumb - "burning" around distal joint.     Skin color and dexterity WNL GI - abdomen soft, no distention, bowel sounds present and bowel patterns reported normal.  Discussed above findings with Avie Echevaria, NP.  Given that patient  is stable without any immediate, emergent symptoms, appointment has been made with NP for tomorrow for further evaluation and treatment.  He was educated that if his symptoms worsen or he develops any stroke -like or cardiac symptoms that he should go to urgent care or ER.

## 2018-01-06 ENCOUNTER — Ambulatory Visit (INDEPENDENT_AMBULATORY_CARE_PROVIDER_SITE_OTHER): Payer: Medicare HMO | Admitting: Internal Medicine

## 2018-01-06 ENCOUNTER — Encounter: Payer: Self-pay | Admitting: Internal Medicine

## 2018-01-06 VITALS — BP 122/74 | HR 76 | Temp 98.2°F | Wt 266.0 lb

## 2018-01-06 DIAGNOSIS — R519 Headache, unspecified: Secondary | ICD-10-CM

## 2018-01-06 DIAGNOSIS — M791 Myalgia, unspecified site: Secondary | ICD-10-CM | POA: Diagnosis not present

## 2018-01-06 DIAGNOSIS — R51 Headache: Secondary | ICD-10-CM

## 2018-01-06 DIAGNOSIS — T466X5A Adverse effect of antihyperlipidemic and antiarteriosclerotic drugs, initial encounter: Secondary | ICD-10-CM | POA: Diagnosis not present

## 2018-01-06 MED ORDER — METHYLPREDNISOLONE ACETATE 80 MG/ML IJ SUSP
80.0000 mg | Freq: Once | INTRAMUSCULAR | Status: AC
  Administered 2018-01-06: 80 mg via INTRAMUSCULAR

## 2018-01-06 NOTE — Patient Instructions (Signed)
Sinus Headache A sinus headache happens when your sinuses become clogged or swollen. You may feel pain or pressure in your face, forehead, ears, or upper teeth. Sinus headaches can be mild or severe. Follow these instructions at home:  Take medicines only as told by your doctor.  If you were given an antibiotic medicine, finish all of it even if you start to feel better.  Use a nose spray if you feel stuffed up (congested).  If told, apply a warm, moist washcloth to your face to help lessen pain. Contact a doctor if:  You get headaches more than one time each week.  Light or sound bothers you.  You have a fever.  You feel sick to your stomach (nauseous) or you throw up (vomit).  Your headaches do not get better with treatment. Get help right away if:  You have trouble seeing.  You suddenly have very bad pain in your face or head.  You start to twitch or shake (seizure).  You are confused.  You have a stiff neck. This information is not intended to replace advice given to you by your health care provider. Make sure you discuss any questions you have with your health care provider. Document Released: 02/17/2011 Document Revised: 06/13/2016 Document Reviewed: 10/14/2014 Elsevier Interactive Patient Education  2018 [employer]vier Inc[employer]  

## 2018-01-06 NOTE — Progress Notes (Signed)
Subjective:    Patient ID: Timothy Alvarado, male    DOB: 10/04/1946, 72 y.o.   MRN: [id number]  HPI  Pt presents to the clinic today with multiple complaints.  1- He reports bilateral leg pain and stiffness. This happens mainly in his calf's. This started 1 week ago. The right seems worse. The pain and stiffness seem worse at night. He denies numbness, tingling or weakness. He has not tried anything OTC for his symptoms. He reports he recently started back on his cholesterol medication. He has not increased his activity or had an injury that he is aware of.  2- He also reports recurrent headaches. He reports this started 1 week ago. It is intermittent He describes the pain as tightness. He reports blurred vision but no dizziness. He reports associated runny nose and post nasal drip  but denies nasal congestion, sore throat or cough. He denies fever, chills or body aches. He has tried Tylenol with some relief.   Review of Systems      Past Medical History:  Diagnosis Date  . Allergy   . GERD (gastroesophageal reflux disease)   . Glaucoma   . Hyperlipidemia     Current Outpatient Medications  Medication Sig Dispense Refill  . acetaminophen (TYLENOL) 500 MG tablet Take 1 tablet (500 mg total) by mouth every 6 (six) hours as needed. 30 tablet 0  . aspirin 81 MG tablet Take 81 mg by mouth daily as needed for pain.     Marland Kitchen aspirin-acetaminophen-caffeine (EXCEDRIN MIGRAINE) 250-250-65 MG tablet Take 2 tablets by mouth every 6 (six) hours as needed for headache.    . bimatoprost (LUMIGAN) 0.01 % SOLN Place 1 drop into both eyes at bedtime.    . ciprofloxacin (CIPRO) 500 MG tablet Take 1 tablet (500 mg total) by mouth 2 (two) times daily. 60 tablet 0  . pantoprazole (PROTONIX) 40 MG tablet Take 1 tablet (40 mg total) by mouth daily. (Patient not taking: Reported on 12/26/2017) 30 tablet 2  . pregabalin (LYRICA) 100 MG capsule Take 1 capsule (100 mg total) by mouth 2 (two) times daily. (Patient  taking differently: Take 100 mg by mouth daily as needed (diabetic nerve pain). ) 60 capsule 0  . ranitidine (ZANTAC) 150 MG tablet Take 150 mg by mouth daily as needed for heartburn.    . rosuvastatin (CRESTOR) 40 MG tablet Take 1 tablet (40 mg total) by mouth daily. 30 tablet 2  . vitamin B-12 (CYANOCOBALAMIN) 100 MCG tablet Take 100 mcg by mouth daily.    . Vitamin D, Ergocalciferol, (DRISDOL) 50000 units CAPS capsule Take 1 capsule (50,000 Units total) by mouth every 7 (seven) days. (Patient not taking: Reported on 12/26/2017) 12 capsule 0   No current facility-administered medications for this visit.     Allergies  Allergen Reactions  . Percocet [Oxycodone-Acetaminophen] Other (See Comments)    hallucinations    Family History  Problem Relation Age of Onset  . Breast cancer Sister   . Prostate cancer Brother   . Hyperlipidemia Brother     Social History   Socioeconomic History  . Marital status: Married    Spouse name: Not on file  . Number of children: Not on file  . Years of education: Not on file  . Highest education level: Not on file  Social Needs  . Financial resource strain: Not on file  . Food insecurity - worry: Not on file  . Food insecurity - inability: Not on file  . Transportation  needs - medical: Not on file  . Transportation needs - non-medical: Not on file  Occupational History  . Not on file  Tobacco Use  . Smoking status: Never Smoker  . Smokeless tobacco: Never Used  Substance and Sexual Activity  . Alcohol use: Yes    Comment: occasional beer  . Drug use: No  . Sexual activity: No  Other Topics Concern  . Not on file  Social History Narrative  . Not on file     Constitutional: Pt reports headache. Denies fever, malaise, fatigue, or abrupt weight changes.  HEENT: Pt reports runny nose and post nasal drip. Denies eye pain, eye redness, ear pain, ringing in the ears, wax buildup, nasal congestion, bloody noset. Respiratory: Denies difficulty  breathing, shortness of breath, cough or sputum production.   Musculoskeletal: Pt reports muscle pain in legs. Denies decrease in range of motion, difficulty with gait, or joint pain and swelling.  Neurological: Denies dizziness, difficulty with memory, difficulty with speech or problems with balance and coordination.    No other specific complaints in a complete review of systems (except as listed in HPI above).  Objective:   Physical Exam   BP 122/74   Pulse 76   Temp 98.2 F (36.8 C) (Oral)   Wt 266 lb (120.7 kg)   SpO2 96%   BMI 35.09 kg/m  Wt Readings from Last 3 Encounters:  01/06/18 266 lb (120.7 kg)  12/26/17 270 lb 6.4 oz (122.7 kg)  12/05/17 264 lb 12 oz (120.1 kg)    General: Appears his stated age, obese in NAD. HEENT: Head: normal shape and size, frontal sinus tenderness noted; Eyes: PERRLA and EOMs intact; Ears: Tm's gray and intact, normal light reflex; Nose: mucosa pink and moist, septum midline; Throat/Mouth: Teeth present, mucosa erythematous and moist, + PND, no exudate, lesions or ulcerations noted.  Neck:  No adenopathy noted. Musculoskeletal: Normal flexion and extension of the knees. No pain with palpation of the calves. No difficulty with gait.  Neurological: Alert and oriented. Sensation intact to BLE. Coordination normal.    BMET    Component Value Date/Time   NA 139 12/26/2017 1005   K 4.0 12/26/2017 1005   CL 103 12/26/2017 1005   CO2 29 12/26/2017 1005   GLUCOSE 103 (H) 12/26/2017 1005   BUN 15 12/26/2017 1005   CREATININE 1.18 12/26/2017 1005   CALCIUM 9.3 12/26/2017 1005   GFRNONAA >60 10/24/2017 0910   GFRAA >60 10/24/2017 0910    Lipid Panel     Component Value Date/Time   CHOL 318 (H) 12/26/2017 1005   TRIG 74.0 12/26/2017 1005   HDL 46.90 12/26/2017 1005   CHOLHDL 7 12/26/2017 1005   VLDL 14.8 12/26/2017 1005   LDLCALC 256 (H) 12/26/2017 1005    CBC    Component Value Date/Time   WBC 5.6 12/26/2017 1005   RBC 4.54  12/26/2017 1005   HGB 13.7 12/26/2017 1005   HCT 41.2 12/26/2017 1005   PLT 265.0 12/26/2017 1005   MCV 90.8 12/26/2017 1005   MCH 30.0 10/24/2017 0910   MCHC 33.2 12/26/2017 1005   RDW 16.1 (H) 12/26/2017 1005    Hgb A1C Lab Results  Component Value Date   HGBA1C 6.3 12/26/2017           Assessment & Plan:   Sinus Headache:  80 mg Depo IM today Start Zyrtec and Flonase OTC  Myalgias:  Hold Crestor x 2 weeks Increase water intake Discussed the importance of  stretching  Let me know if 1-2 weeks if leg pain has not improved Merwin Breden, NP

## 2018-01-09 ENCOUNTER — Encounter: Payer: Self-pay | Admitting: Internal Medicine

## 2018-01-09 ENCOUNTER — Ambulatory Visit (INDEPENDENT_AMBULATORY_CARE_PROVIDER_SITE_OTHER): Payer: Medicare HMO | Admitting: Internal Medicine

## 2018-01-09 VITALS — BP 110/76 | HR 83 | Resp 16 | Ht 73.0 in | Wt 265.0 lb

## 2018-01-09 DIAGNOSIS — G4719 Other hypersomnia: Secondary | ICD-10-CM | POA: Diagnosis not present

## 2018-01-09 DIAGNOSIS — Z72821 Inadequate sleep hygiene: Secondary | ICD-10-CM | POA: Diagnosis not present

## 2018-01-09 NOTE — Patient Instructions (Addendum)
Will send for sleep study.  You are sleeping too much during the day and this is making it hard for you to sleep at night. You need to stop napping during the day.     Sleep Apnea      Sleep apnea is disorder that affects a person's sleep. A person with sleep apnea has abnormal pauses in their breathing when they sleep. It is hard for them to get a good sleep. This makes a person tired during the day. It also can lead to other physical problems. There are three types of sleep apnea. One type is when breathing stops for a short time because your airway is blocked (obstructive sleep apnea). Another type is when the brain sometimes fails to give the normal signal to breathe to the muscles that control your breathing (central sleep apnea). The third type is a combination of the other two types. HOME CARE   Take all medicine as told by your doctor.  Avoid alcohol, calming medicines (sedatives), and depressant drugs.  Try to lose weight if you are overweight. Talk to your doctor about a healthy weight goal.  Your doctor may have you use a device that helps to open your airway. It can help you get the air that you need. It is called a positive airway pressure (PAP) device.   MAKE SURE YOU:   Understand these instructions.  Will watch your condition.  Will get help right away if you are not doing well or get worse.  It may take approximately 1 month for you to get used to wearing her CPAP every night.  Be sure to work with your machine to get used to it, be patient, it may take time!

## 2018-01-09 NOTE — Progress Notes (Signed)
[facility] Consultation      Assessment and Plan:  Excessive daytime sleepiness. -Symptoms and signs of obstructive sleep apnea, will send for sleep study.  Poor sleep hygiene. - Patient has sleeping excessively during the day, which is reducing his nighttime sleepiness. -Discussed importance of minimizing daytime naps to allow him to sleep more at night.  Orders Placed This Encounter  Procedures  . Split night study   Return in about 3 months (around 04/11/2018).   Date: 01/09/2018  MRN# [id number] Timothy Alvarado 70-Oct-1947   Timothy Alvarado is a 72 y.o. old male seen in consultation for chief complaint of:    Chief Complaint  Patient presents with  . Consult    referred by Dr.Regina Baity.  . Shortness of Breath    Pt c/o dreaming, sob while sleeping and fatigue.    HPI:   He has trouble sleep at night. He goes to bed at variable times, he is retired, he has no specific bedtime. He lays in bed for much of the day and is bedroom for nearly all of the day. He leaves the bedroom for cook, eat. He drives a car and does all of his own cooking and cleaning.   He thinks that he sleep an equal amount of time during the day and night. He notices that at night when he is sleeping a dream will wake him up and has sensation that while he is sleeping his body will go numb and that will wake him up from sleep. He feels tired almost all of the time. No sleep walking or sleep attacks.   He has sinus drainage, he uses a saline nasal spray once per day.   He has only 2 teeth but does not have dentures.    PMHX:   Past Medical History:  Diagnosis Date  . Allergy   . GERD (gastroesophageal reflux disease)   . Glaucoma   . Hyperlipidemia    Surgical Hx:  Past Surgical History:  Procedure Laterality Date  . BACK SURGERY     lumbar   . ROTATOR CUFF REPAIR Left    Family Hx:  Family History  Problem Relation Age of Onset  . Breast cancer Sister   . Prostate cancer  Brother   . Hyperlipidemia Brother    Social Hx:   Social History   Tobacco Use  . Smoking status: Never Smoker  . Smokeless tobacco: Never Used  Substance Use Topics  . Alcohol use: Yes    Comment: occasional beer  . Drug use: No   Medication:    Current Outpatient Medications:  .  acetaminophen (TYLENOL) 500 MG tablet, Take 1 tablet (500 mg total) by mouth every 6 (six) hours as needed., Disp: 30 tablet, Rfl: 0 .  aspirin 81 MG tablet, Take 81 mg by mouth daily as needed for pain. , Disp: , Rfl:  .  aspirin-acetaminophen-caffeine (EXCEDRIN MIGRAINE) 250-250-65 MG tablet, Take 2 tablets by mouth every 6 (six) hours as needed for headache., Disp: , Rfl:  .  bimatoprost (LUMIGAN) 0.01 % SOLN, Place 1 drop into both eyes at bedtime., Disp: , Rfl:  .  pantoprazole (PROTONIX) 40 MG tablet, Take 1 tablet (40 mg total) by mouth daily., Disp: 30 tablet, Rfl: 2 .  pregabalin (LYRICA) 100 MG capsule, Take 1 capsule (100 mg total) by mouth 2 (two) times daily. (Patient taking differently: Take 100 mg by mouth daily as needed (diabetic nerve pain). ), Disp: 60 capsule, Rfl: 0 .  ranitidine (ZANTAC) 150 MG tablet, Take 150 mg by mouth daily as needed for heartburn., Disp: , Rfl:  .  rosuvastatin (CRESTOR) 40 MG tablet, Take 1 tablet (40 mg total) by mouth daily., Disp: 30 tablet, Rfl: 2 .  vitamin B-12 (CYANOCOBALAMIN) 100 MCG tablet, Take 100 mcg by mouth daily., Disp: , Rfl:    Allergies:  Percocet [oxycodone-acetaminophen]  Review of Systems: Gen:  Denies  fever, sweats, chills HEENT: Denies blurred vision, double vision. bleeds, sore throat Cvc:  No dizziness, chest pain. Resp:   Denies cough or sputum production, shortness of breath Gi: Denies swallowing difficulty, stomach pain. Gu:  Denies bladder incontinence, burning urine Ext:   No Joint pain, stiffness. Skin: No skin rash,  hives  Endoc:  No polyuria, polydipsia. Psych: No depression, insomnia. Other:  All other systems were  reviewed with the patient and were negative other that what is mentioned in the HPI.   Physical Examination:   VS: BP 110/76 (BP Location: Left Arm, Cuff Size: Large)   Pulse 83   Resp 16   Ht 6\' 1"  (1.854 m)   Wt 265 lb (120.2 kg)   SpO2 96%   BMI 34.96 kg/m   General Appearance: No distress  Neuro:without focal findings,  speech normal,  HEENT: PERRLA, EOM intact.   Pulmonary: normal breath sounds, No wheezing.  CardiovascularNormal S1,S2.  No m/r/g.   Abdomen: Benign, Soft, non-tender. Renal:  No costovertebral tenderness  GU:  No performed at this time. Endoc: No evident thyromegaly, no signs of acromegaly. Skin:   warm, no rashes, no ecchymosis  Extremities: normal, no cyanosis, clubbing.  Other findings:    LABORATORY PANEL:   CBC No results for input(s): WBC, HGB, HCT, PLT in the last 168 hours. ------------------------------------------------------------------------------------------------------------------  Chemistries  No results for input(s): NA, K, CL, CO2, GLUCOSE, BUN, CREATININE, CALCIUM, MG, AST, ALT, ALKPHOS, BILITOT in the last 168 hours.  Invalid input(s): GFRCGP ------------------------------------------------------------------------------------------------------------------  Cardiac Enzymes No results for input(s): TROPONINI in the last 168 hours. ------------------------------------------------------------  RADIOLOGY:  No results found.     Thank  you for the consultation and for allowing [facility] to assist in the care of your patient. Our recommendations are noted above.  Please contact us if we can be of further service.   Marda Stalker, MD.  Board Certified in Internal Medicine, Pulmonary Medicine, [place], and Sleep Medicine.  [facility] Office Number: [phone]  Patricia Pesa, M.D.  Merton Border, M.D  01/09/2018

## 2018-01-17 ENCOUNTER — Telehealth: Payer: Self-pay | Admitting: Internal Medicine

## 2018-01-17 NOTE — Telephone Encounter (Signed)
He was supposed to call me back and let me know wether his leg pain improved or not off the statin.

## 2018-01-17 NOTE — Telephone Encounter (Signed)
Copied from [place] [facility]. Topic: Quick Communication - See Telephone Encounter >> Jan 17, 2018  1:16 PM Burnis Medin, NT wrote: CRM for notification. See Telephone encounter for: Patient called  and said doctor took him off of his cholesterol medication and nothing has been sent in place of that. Pls send prescription to [id number], [place] [phone] (Phone) [phone] (Fax)    01/17/18.

## 2018-01-18 NOTE — Telephone Encounter (Signed)
Left detailed msg on VM per HIPAA  

## 2018-01-20 ENCOUNTER — Encounter: Payer: Self-pay | Admitting: Family Medicine

## 2018-01-20 ENCOUNTER — Ambulatory Visit (INDEPENDENT_AMBULATORY_CARE_PROVIDER_SITE_OTHER): Payer: Medicare HMO | Admitting: Family Medicine

## 2018-01-20 VITALS — BP 124/78 | HR 101 | Temp 98.1°F | Wt 269.0 lb

## 2018-01-20 DIAGNOSIS — J302 Other seasonal allergic rhinitis: Secondary | ICD-10-CM

## 2018-01-20 DIAGNOSIS — R51 Headache: Secondary | ICD-10-CM | POA: Diagnosis not present

## 2018-01-20 DIAGNOSIS — R519 Headache, unspecified: Secondary | ICD-10-CM | POA: Insufficient documentation

## 2018-01-20 MED ORDER — FLUTICASONE PROPIONATE 50 MCG/ACT NA SUSP: 2.0000 | Freq: Every day | NASAL | Status: DC

## 2018-01-20 MED ORDER — FLUTICASONE PROPIONATE 50 MCG/ACT NA SUSP: 2.0000 | Freq: Every day | NASAL | 3 refills | Status: AC

## 2018-01-20 NOTE — Patient Instructions (Addendum)
Headaches could be due to many causes - possibly sleep apnea related, could be sinus congestion related.  Start taking flonase nasal steroid daily, continue nasal saline as needed Continue tylenol as needed as well.  We will look for results of sleep study.

## 2018-01-20 NOTE — Progress Notes (Signed)
BP 124/78 (BP Location: Left Arm, Patient Position: Sitting, Cuff Size: Large)   Pulse (!) 101   Temp 98.1 F (36.7 C) (Oral)   Wt 269 lb (122 kg)   SpO2 95%   BMI 35.49 kg/m    CC: HA Subjective:    Patient ID: Timothy Alvarado, male    DOB: May 12, 1946, 72 y.o.   MRN: [id number]  HPI: Timothy Alvarado is a 72 y.o. male presenting on 01/20/2018 for Headache (Had about 2 days. Tried Tylenol and sinus med, helpful.)   Worsening headaches over last few days. Describes frontal and R sided pressure pain at its worst 6/10. Doesn't get sick with headaches. Mild nasal congestion and rhinorrhea. Headache can start early in the morning and last the whole day. No pain at temple, no vision changes. Tylenol has helped, as well as excedrin. Has tried OTC nasal saline which helps some.  Also feels chills and stiffness in arms.   Denies fevers, vision changes, nausea, photo/phonophobia, vision changes, auras. No neck pain or vision changes.   Presumed sinus headache when last seen with PCP treated with 70m depo IM. This was beneficial for a few days.  Undergoing workup for sleep apnea pending sleep study.   Caffeine - 1 cup a day of tea.  Trouble sleeping - averages 5 hours of sleep per night, 2 hours of napping during the day.   Relevant past medical, surgical, family and social history reviewed and updated as indicated. Interim medical history since our last visit reviewed. Allergies and medications reviewed and updated. Outpatient Medications Prior to Visit  Medication Sig Dispense Refill  . acetaminophen (TYLENOL) 500 MG tablet Take 1 tablet (500 mg total) by mouth every 6 (six) hours as needed. 30 tablet 0  . aspirin 81 MG tablet Take 81 mg by mouth daily as needed for pain.     .Marland Kitchenaspirin-acetaminophen-caffeine (EXCEDRIN MIGRAINE) 250-250-65 MG tablet Take 2 tablets by mouth every 6 (six) hours as needed for headache.    . bimatoprost (LUMIGAN) 0.01 % SOLN Place 1 drop into both eyes at bedtime.      . pantoprazole (PROTONIX) 40 MG tablet Take 1 tablet (40 mg total) by mouth daily. 30 tablet 2  . pregabalin (LYRICA) 100 MG capsule Take 1 capsule (100 mg total) by mouth 2 (two) times daily. (Patient taking differently: Take 100 mg by mouth daily as needed (diabetic nerve pain). ) 60 capsule 0  . ranitidine (ZANTAC) 150 MG tablet Take 150 mg by mouth daily as needed for heartburn.    . rosuvastatin (CRESTOR) 40 MG tablet Take 1 tablet (40 mg total) by mouth daily. 30 tablet 2  . vitamin B-12 (CYANOCOBALAMIN) 100 MCG tablet Take 100 mcg by mouth daily.     No facility-administered medications prior to visit.      Per HPI unless specifically indicated in ROS section below Review of Systems     Objective:    BP 124/78 (BP Location: Left Arm, Patient Position: Sitting, Cuff Size: Large)   Pulse (!) 101   Temp 98.1 F (36.7 C) (Oral)   Wt 269 lb (122 kg)   SpO2 95%   BMI 35.49 kg/m   Wt Readings from Last 3 Encounters:  01/20/18 269 lb (122 kg)  01/09/18 265 lb (120.2 kg)  01/06/18 266 lb (120.7 kg)    Physical Exam  Constitutional: He appears well-developed and well-nourished. No distress.  HENT:  Head: Normocephalic and atraumatic.  Right Ear: Hearing, tympanic membrane, external  ear and ear canal normal.  Left Ear: Hearing, tympanic membrane, external ear and ear canal normal.  Nose: Nose normal. No mucosal edema or rhinorrhea. Right sinus exhibits no maxillary sinus tenderness and no frontal sinus tenderness. Left sinus exhibits no maxillary sinus tenderness and no frontal sinus tenderness.  Mouth/Throat: Uvula is midline, oropharynx is clear and moist and mucous membranes are normal. No oropharyngeal exudate, posterior oropharyngeal edema, posterior oropharyngeal erythema or tonsillar abscesses.  No pain at temples or at TMJs  Eyes: Pupils are equal, round, and reactive to light. Conjunctivae and EOM are normal. No scleral icterus.  Neck: Normal range of motion. Neck supple.   Cardiovascular: Normal rate, regular rhythm, normal heart sounds and intact distal pulses.  No murmur heard. Pulmonary/Chest: Effort normal and breath sounds normal. No respiratory distress. He has no wheezes. He has no rales.  Lymphadenopathy:    He has no cervical adenopathy.  Neurological: He is alert. No cranial nerve deficit.  CN 2-12 intact FTN intact EOMI  Skin: Skin is warm and dry. No rash noted.  Nursing note and vitals reviewed.  Lab Results  Component Value Date   CREATININE 1.18 12/26/2017       Assessment & Plan:   Problem List Items Addressed This Visit    Headache - Primary    Recently worsening frontal and R sided pressure headaches, along with non focal neurological exam today. Endorses congestion, poor sleep. no significant caffeine use. ?sinus headache vs sleep apnea related. Discussed with patient, rec continue tylenol, nasal saline, start flonase.  Not consistent with temporal arteritis, migraine, or cervicogenic headache, doubt central cause.  Denies vision changes or red eye to suggest worsening glaucoma.  If no better with above, would consider ESR to help fully r/o TA.       Seasonal allergies       Meds ordered this encounter  Medications  . DISCONTD: fluticasone (FLONASE) 50 MCG/ACT nasal spray    Sig: Place 2 sprays into both nostrils daily.  . fluticasone (FLONASE) 50 MCG/ACT nasal spray    Sig: Place 2 sprays into both nostrils daily.    Dispense:  16 g    Refill:  3   No orders of the defined types were placed in this encounter.   Follow up plan: Return if symptoms worsen or fail to improve.  Ria Bush, MD

## 2018-01-20 NOTE — Assessment & Plan Note (Addendum)
Recently worsening frontal and R sided pressure headaches, along with non focal neurological exam today. Endorses congestion, poor sleep. no significant caffeine use. ?sinus headache vs sleep apnea related. Discussed with patient, rec continue tylenol, nasal saline, start flonase.  Not consistent with temporal arteritis, migraine, or cervicogenic headache, doubt central cause.  Denies vision changes or red eye to suggest worsening glaucoma.  If no better with above, would consider ESR to help fully r/o TA.

## 2018-01-23 ENCOUNTER — Ambulatory Visit: Payer: Medicare HMO | Admitting: Internal Medicine

## 2018-01-25 ENCOUNTER — Encounter: Payer: Self-pay | Admitting: Urology

## 2018-01-25 ENCOUNTER — Ambulatory Visit: Payer: Medicare HMO | Admitting: Urology

## 2018-01-25 VITALS — BP 114/68 | HR 85 | Ht 74.0 in | Wt 263.0 lb

## 2018-01-25 DIAGNOSIS — N401 Enlarged prostate with lower urinary tract symptoms: Secondary | ICD-10-CM | POA: Diagnosis not present

## 2018-01-25 DIAGNOSIS — R972 Elevated prostate specific antigen [PSA]: Secondary | ICD-10-CM

## 2018-01-25 LAB — MICROSCOPIC EXAMINATION
Bacteria, UA: NONE SEEN
EPITHELIAL CELLS (NON RENAL): NONE SEEN /HPF (ref 0–10)
WBC, UA: NONE SEEN /hpf (ref 0–5)

## 2018-01-25 LAB — URINALYSIS, COMPLETE
Bilirubin, UA: NEGATIVE
Glucose, UA: NEGATIVE
LEUKOCYTES UA: NEGATIVE
Nitrite, UA: NEGATIVE
PROTEIN UA: NEGATIVE
Urobilinogen, Ur: 2 mg/dL — ABNORMAL HIGH (ref 0.2–1.0)
pH, UA: 5.5 (ref 5.0–7.5)

## 2018-01-25 MED ORDER — TAMSULOSIN HCL 0.4 MG PO CAPS: 0.4000 mg | ORAL_CAPSULE | Freq: Every day | ORAL | 1 refills | Status: DC

## 2018-01-25 NOTE — Progress Notes (Signed)
01/25/2018 10:53 AM   Timothy Alvarado Jul 13, 1946 [id number]  Referring provider: Jearld Fenton, NP [address] [place], [place] [address]  Chief Complaint  Patient presents with  . Elevated PSA    New Patient    HPI: Timothy Alvarado is a 72 year old male seen at the request of Golden Hurter for evaluation of an elevated PSA.  He was seen in January 2019 complaining of groin and perineal pain.  He recently moved to the area from [place] and has previously been evaluated and treated for prostatitis and an elevated PSA.  He has never had a prostate biopsy.  He states his PSA returned to normal.  A PSA drawn on 12/26/2017 was 7.06.  He complains of urinary frequency, urgency, nocturia x2, urinary hesitancy and occasional straining to urinate.  She denies dysuria or gross hematuria.   PMH: Past Medical History:  Diagnosis Date  . Allergy   . GERD (gastroesophageal reflux disease)   . Glaucoma   . Hyperlipidemia     Surgical History: Past Surgical History:  Procedure Laterality Date  . BACK SURGERY     lumbar   . ROTATOR CUFF REPAIR Left     Home Medications:  Allergies as of 01/25/2018      Reactions   Percocet [oxycodone-acetaminophen] Other (See Comments)   hallucinations      Medication List        Accurate as of 01/25/18 11:59 PM. Always use your most recent med list.          acetaminophen 500 MG tablet Commonly known as:  TYLENOL Take 1 tablet (500 mg total) by mouth every 6 (six) hours as needed.   aspirin 81 MG tablet Take 81 mg by mouth daily as needed for pain.   aspirin-acetaminophen-caffeine 250-250-65 MG tablet Commonly known as:  EXCEDRIN MIGRAINE Take 2 tablets by mouth every 6 (six) hours as needed for headache.   bimatoprost 0.01 % Soln Commonly known as:  LUMIGAN Place 1 drop into both eyes at bedtime.   fluticasone 50 MCG/ACT nasal spray Commonly known as:  FLONASE Place 2 sprays into both nostrils daily.   pantoprazole 40 MG  tablet Commonly known as:  PROTONIX Take 1 tablet (40 mg total) by mouth daily.   pregabalin 100 MG capsule Commonly known as:  LYRICA Take 1 capsule (100 mg total) by mouth 2 (two) times daily.   ranitidine 150 MG tablet Commonly known as:  ZANTAC Take 150 mg by mouth daily as needed for heartburn.   rosuvastatin 40 MG tablet Commonly known as:  CRESTOR Take 1 tablet (40 mg total) by mouth daily.   tamsulosin 0.4 MG Caps capsule Commonly known as:  FLOMAX Take 1 capsule (0.4 mg total) by mouth daily.   vitamin B-12 100 MCG tablet Commonly known as:  CYANOCOBALAMIN Take 100 mcg by mouth daily.       Allergies:  Allergies  Allergen Reactions  . Percocet [Oxycodone-Acetaminophen] Other (See Comments)    hallucinations    Family History: Family History  Problem Relation Age of Onset  . Breast cancer Sister   . Prostate cancer Brother   . Hyperlipidemia Brother     Social History:  reports that he has never smoked. He has never used smokeless tobacco. He reports that he drinks alcohol. He reports that he does not use drugs.  ROS: UROLOGY Frequent Urination?: Yes Hard to postpone urination?: Yes Burning/pain with urination?: No Get up at night to urinate?: Yes Leakage  of urine?: Yes Urine stream starts and stops?: No Trouble starting stream?: Yes Do you have to strain to urinate?: Yes Blood in urine?: No Urinary tract infection?: No Sexually transmitted disease?: No Injury to kidneys or bladder?: No Painful intercourse?: No Weak stream?: No Erection problems?: No Penile pain?: No  Gastrointestinal Nausea?: No Vomiting?: No Indigestion/heartburn?: No Diarrhea?: No Constipation?: No  Constitutional Fever: No Night sweats?: No Weight loss?: No Fatigue?: Yes  Skin Skin rash/lesions?: No Itching?: No  Eyes Blurred vision?: No Double vision?: No  Ears/Nose/Throat Sore throat?: Yes Sinus problems?: Yes  Hematologic/Lymphatic Swollen  glands?: No Easy bruising?: No  Cardiovascular Leg swelling?: Yes Chest pain?: No  Respiratory Cough?: Yes Shortness of breath?: Yes  Endocrine Excessive thirst?: No  Musculoskeletal Back pain?: No Joint pain?: No  Neurological Headaches?: Yes Dizziness?: No  Psychologic Depression?: No Anxiety?: No  Physical Exam: There were no vitals taken for this visit.  Constitutional:  Alert and oriented, No acute distress. HEENT: [place] AT, moist mucus membranes.  Trachea midline, no masses. Cardiovascular: No clubbing, cyanosis, or edema. Respiratory: Normal respiratory effort, no increased work of breathing. GI: Abdomen is soft, nontender, nondistended, no abdominal masses GU: No CVA tenderness.  Prostate 40 g, smooth without nodules. Lymph: No cervical or inguinal lymphadenopathy. Skin: No rashes, bruises or suspicious lesions. Neurologic: Grossly intact, no focal deficits, moving all 4 extremities. Psychiatric: Normal mood and affect.  Laboratory Data: Lab Results  Component Value Date   WBC 5.6 12/26/2017   HGB 13.7 12/26/2017   HCT 41.2 12/26/2017   MCV 90.8 12/26/2017   PLT 265.0 12/26/2017    Lab Results  Component Value Date   CREATININE 1.18 12/26/2017    Lab Results  Component Value Date   PSA 7.06 (H) 12/26/2017    No results found for: TESTOSTERONE  Lab Results  Component Value Date   HGBA1C 6.3 12/26/2017    Urinalysis Dipstick/microscopy negative  Pertinent Imaging: N/A  Assessment & Plan:    1. Elevated PSA We discussed potential causes of an elevated PSA including BPH, inflammation/prostatitis and prostate cancer.  His urinalysis today was unremarkable.  His PSA was repeated today.    Although PSA is a prostate cancer screening test he was informed that cancer is not the most common cause of an elevated PSA. Other potential causes including BPH and inflammation were discussed. He was informed that the only way to adequately diagnose  prostate cancer would be a transrectal ultrasound and biopsy of the prostate. The procedure was discussed including potential risks of bleeding and infection/sepsis. He was also informed that a negative biopsy does not conclusively rule out the possibility that prostate cancer may be present and that continued monitoring is required. The use of newer adjunctive blood tests including PHI and 4kScore were discussed. The use of multiparametric prostate MRI was also discussed however is not typically used for initial evaluation of an elevated PSA. Continued periodic surveillance was also discussed.    He will think over these options and will be notified with his repeat PSA results.  An Rx for tamsulosin was sent to his pharmacy for his voiding symptoms and pelvic pain.  Follow-up 6 weeks for recheck.   - Urinalysis, Complete - PSA   Return in about 6 weeks (around 03/08/2018) for Recheck.  Abbie Sons, [place] [address] [place], [place] [address] [phone]

## 2018-01-26 ENCOUNTER — Encounter: Payer: Self-pay | Admitting: Urology

## 2018-01-26 ENCOUNTER — Ambulatory Visit: Payer: Medicare HMO | Attending: Internal Medicine

## 2018-01-26 DIAGNOSIS — R972 Elevated prostate specific antigen [PSA]: Secondary | ICD-10-CM | POA: Insufficient documentation

## 2018-01-26 DIAGNOSIS — G4710 Hypersomnia, unspecified: Secondary | ICD-10-CM | POA: Diagnosis present

## 2018-01-26 DIAGNOSIS — G4761 Periodic limb movement disorder: Secondary | ICD-10-CM | POA: Diagnosis not present

## 2018-01-26 DIAGNOSIS — G4733 Obstructive sleep apnea (adult) (pediatric): Secondary | ICD-10-CM | POA: Diagnosis not present

## 2018-01-26 DIAGNOSIS — N401 Enlarged prostate with lower urinary tract symptoms: Secondary | ICD-10-CM | POA: Insufficient documentation

## 2018-01-26 LAB — PSA: PROSTATE SPECIFIC AG, SERUM: 7.9 ng/mL — AB (ref 0.0–4.0)

## 2018-01-30 ENCOUNTER — Telehealth: Payer: Self-pay

## 2018-01-30 NOTE — Telephone Encounter (Signed)
Tried calling patient , unable to leave vmail mail box full

## 2018-01-30 NOTE — Telephone Encounter (Signed)
-----   Message from Abbie Sons, MD sent at 01/27/2018 12:27 PM EDT ----- PSA remains elevated at 7.9.  We can schedule a prostate biopsy or he can keep his follow-up appointment and can repeat after his course of medication.

## 2018-01-31 DIAGNOSIS — G4733 Obstructive sleep apnea (adult) (pediatric): Secondary | ICD-10-CM | POA: Diagnosis not present

## 2018-02-01 ENCOUNTER — Telehealth: Payer: Self-pay | Admitting: *Deleted

## 2018-02-01 DIAGNOSIS — G4733 Obstructive sleep apnea (adult) (pediatric): Secondary | ICD-10-CM

## 2018-02-01 NOTE — Telephone Encounter (Signed)
Pt informed he needs to have a CPAP titration study done. Order placed. Nothing further needed.

## 2018-02-02 ENCOUNTER — Other Ambulatory Visit: Payer: Self-pay | Admitting: Family Medicine

## 2018-02-04 DIAGNOSIS — R51 Headache: Secondary | ICD-10-CM | POA: Diagnosis not present

## 2018-02-06 ENCOUNTER — Encounter: Payer: Self-pay | Admitting: Family Medicine

## 2018-02-06 ENCOUNTER — Ambulatory Visit (INDEPENDENT_AMBULATORY_CARE_PROVIDER_SITE_OTHER): Payer: Medicare HMO | Admitting: Family Medicine

## 2018-02-06 VITALS — BP 122/62 | HR 64 | Temp 97.7°F | Wt 266.8 lb

## 2018-02-06 DIAGNOSIS — M545 Low back pain, unspecified: Secondary | ICD-10-CM

## 2018-02-06 DIAGNOSIS — R51 Headache: Secondary | ICD-10-CM | POA: Diagnosis not present

## 2018-02-06 DIAGNOSIS — R700 Elevated erythrocyte sedimentation rate: Secondary | ICD-10-CM

## 2018-02-06 DIAGNOSIS — R519 Headache, unspecified: Secondary | ICD-10-CM

## 2018-02-06 LAB — SEDIMENTATION RATE: Sed Rate: 63 mm/hr — ABNORMAL HIGH (ref 0–20)

## 2018-02-06 LAB — POC URINALSYSI DIPSTICK (AUTOMATED)
BILIRUBIN UA: NEGATIVE
Blood, UA: NEGATIVE
GLUCOSE UA: NEGATIVE
Ketones, UA: NEGATIVE
LEUKOCYTES UA: NEGATIVE
NITRITE UA: NEGATIVE
Protein, UA: NEGATIVE
Spec Grav, UA: 1.01 (ref 1.010–1.025)
UROBILINOGEN UA: 0.2 U/dL
pH, UA: 6 (ref 5.0–8.0)

## 2018-02-06 MED ORDER — TIZANIDINE HCL 4 MG PO CAPS: 4.0000 mg | ORAL_CAPSULE | Freq: Three times a day (TID) | ORAL | 0 refills | Status: DC | PRN

## 2018-02-06 NOTE — Progress Notes (Signed)
Subjective:    Patient ID: Timothy Alvarado, male    DOB: 08-Apr-1946, 72 y.o.   MRN: [id number]  HPI This is a 72 yo male who presents today with persistent headache. Was seen by Dr. Darnell Level 01/20/18 and again 02/04/18 at Urgent Care and was given a prescription for Naproxen which did not help. Headache mostly on right side, some neck tightness and pain. Lost his glasses about a week ago. Doesn't wear his glasses all the time, just got them in December and has been getting used to them. No nasal drainage, no nasal congestion. Some intermittent nausea. Headache worse with lying down, usually feels ok when he awakens then headache comes on. Nothing seems to relieve or worsen.   Left sided pain started 3 days ago, has moved to right side. No recent falls. Doesn't feel like muscle pain, feels deeper. No dysuria, no hematuria. Possibly some increase in BM amount, no diarrhea, no blood in stool. Side pain worse with lying down and getting up first thing in the morning. Occasionally has tingling in arms/legs. No weakness.   Poor sleep. Frequent awakening. Longstanding, recent sleep study, has follow up for CPAP.  No increased stress. Good appetite.    Past Medical History:  Diagnosis Date  . Allergy   . GERD (gastroesophageal reflux disease)   . Glaucoma   . Hyperlipidemia    Past Surgical History:  Procedure Laterality Date  . BACK SURGERY     lumbar   . ROTATOR CUFF REPAIR Left    Family History  Problem Relation Age of Onset  . Breast cancer Sister   . Prostate cancer Brother   . Hyperlipidemia Brother    Social History   Tobacco Use  . Smoking status: Never Smoker  . Smokeless tobacco: Never Used  Substance Use Topics  . Alcohol use: Yes    Comment: occasional beer  . Drug use: No      Review of Systems Per HPI    Objective:   Physical Exam  Constitutional: He is oriented to person, place, and time. He appears well-developed and well-nourished. No distress.  HENT:  Head:  Normocephalic.  Mouth/Throat: Oropharynx is clear and moist.  Eyes: Pupils are equal, round, and reactive to light. Conjunctivae and EOM are normal. Right eye exhibits no discharge. Left eye exhibits no discharge.  Neck: Normal range of motion. Neck supple. Spinous process tenderness and muscular tenderness (bilateral trapezius) present.  Cardiovascular: Normal rate, regular rhythm and normal heart sounds.  Pulmonary/Chest: Effort normal and breath sounds normal.  Abdominal: Soft. Bowel sounds are normal. He exhibits no distension. There is no tenderness. There is no rebound, no guarding and no CVA tenderness.  Musculoskeletal:       Thoracic back: He exhibits tenderness.       Lumbar back: He exhibits tenderness (bilateral low back ).  UE/LE strength 5/5 Hamstrings tight with straight leg raise, no back pain  Lymphadenopathy:    He has no cervical adenopathy.  Neurological: He is alert and oriented to person, place, and time. He has normal reflexes. No cranial nerve deficit. Coordination normal.  Skin: Skin is warm and dry. He is not diaphoretic.  Psychiatric: He has a normal mood and affect. His behavior is normal. Judgment and thought content normal.  Vitals reviewed.     BP 122/62   Pulse 64   Temp 97.7 F (36.5 C) (Oral)   Wt 266 lb 12 oz (121 kg)   SpO2 94%   BMI 34.25  kg/m  Wt Readings from Last 3 Encounters:  02/06/18 266 lb 12 oz (121 kg)  01/26/18 263 lb (119.3 kg)  01/20/18 269 lb (122 kg)   Results for orders placed or performed in visit on 02/06/18  POCT Urinalysis Dipstick (Automated)  Result Value Ref Range   Color, UA yellow    Clarity, UA clear    Glucose, UA neg    Bilirubin, UA neg    Ketones, UA neg    Spec Grav, UA 1.010 1.010 - 1.025   Blood, UA neg    pH, UA 6.0 5.0 - 8.0   Protein, UA neg    Urobilinogen, UA 0.2 0.2 or 1.0 E.U./dL   Nitrite, UA neg    Leukocytes, UA Negative Negative       Assessment & Plan:  1. Nonintractable episodic  headache, unspecified headache type - unsure why patient having frequent, intermittent headaches. Neuro exam unremarkable, trapezius muscles tight and tender, will check ESR, try muscle relaxer, encouraged him to wear his glasses when needed, follow up for OSA. - if no improvement, may need CT - Sedimentation rate - tiZANidine (ZANAFLEX) 4 MG capsule; Take 1 capsule (4 mg total) by mouth 3 (three) times daily as needed for muscle spasms.  Dispense: 20 capsule; Refill: 0 - RTC precautions reviewed  2. Acute bilateral low back pain without sciatica - suspect MSK in nature, will try muscle relaxer, continue naprosyn - POCT Urinalysis Dipstick (Automated)   Clarene Reamer, FNP-BC  [facility], [place]  02/06/2018 2:00 PM

## 2018-02-06 NOTE — Patient Instructions (Signed)
Good to see you today  I am checking some blood work to check for inflammation that can cause headache  I have sent in a prescription for a muscle relaxer for your neck and side pain, I am hoping it will also help your headache. Please continue the Naprosyn for your headache as well.   Your urine test was normal.   Please drink enough water to make your urine light yellow.    General Headache Without Cause A headache is pain or discomfort felt around the head or neck area. There are many causes and types of headaches. In some cases, the cause may not be found. Follow these instructions at home: Managing pain  Take over-the-counter and prescription medicines only as told by your doctor.  Lie down in a dark, quiet room when you have a headache.  If directed, apply ice to the head and neck area: ? Put ice in a plastic bag. ? Place a towel between your skin and the bag. ? Leave the ice on for 20 minutes, 2-3 times per day.  Use a heating pad or hot shower to apply heat to the head and neck area as told by your doctor.  Keep lights dim if bright lights bother you or make your headaches worse. Eating and drinking  Eat meals on a regular schedule.  Lessen how much alcohol you drink.  Lessen how much caffeine you drink, or stop drinking caffeine. General instructions  Keep all follow-up visits as told by your doctor. This is important.  Keep a journal to find out if certain things bring on headaches. For example, write down: ? What you eat and drink. ? How much sleep you get. ? Any change to your diet or medicines.  Relax by getting a massage or doing other relaxing activities.  Lessen stress.  Sit up straight. Do not tighten (tense) your muscles.  Do not use tobacco products. This includes cigarettes, chewing tobacco, or e-cigarettes. If you need help quitting, ask your doctor.  Exercise regularly as told by your doctor.  Get enough sleep. This often means 7-9 hours of  sleep. Contact a doctor if:  Your symptoms are not helped by medicine.  You have a headache that feels different than the other headaches.  You feel sick to your stomach (nauseous) or you throw up (vomit).  You have a fever. Get help right away if:  Your headache becomes really bad.  You keep throwing up.  You have a stiff neck.  You have trouble seeing.  You have trouble speaking.  You have pain in the eye or ear.  Your muscles are weak or you lose muscle control.  You lose your balance or have trouble walking.  You feel like you will pass out (faint) or you pass out.  You have confusion. This information is not intended to replace advice given to you by your health care provider. Make sure you discuss any questions you have with your health care provider. Document Released: 07/27/2008 Document Revised: 03/25/2016 Document Reviewed: 02/10/2015 Elsevier Interactive Patient Education  [employer].

## 2018-02-07 ENCOUNTER — Telehealth: Payer: Self-pay

## 2018-02-07 MED ORDER — TIZANIDINE HCL 4 MG PO TABS: 4.0000 mg | ORAL_TABLET | Freq: Three times a day (TID) | ORAL | 0 refills | Status: DC

## 2018-02-07 MED ORDER — TIZANIDINE HCL 4 MG PO TABS: 4.0000 mg | ORAL_TABLET | Freq: Three times a day (TID) | ORAL | 0 refills | Status: DC | PRN

## 2018-02-07 NOTE — Telephone Encounter (Signed)
Timothy Alvarado is out of the office. Spoken with PCP Rollene Fare and she stated okay to change to tablets. [facility].

## 2018-02-07 NOTE — Telephone Encounter (Signed)
Copied from [place] [id number]. Topic: General - Other >> Feb 07, 2018  9:41 AM Carolyn Stare wrote:  the below med was sent to the pharmacy and pt insurance will not cover the capsule but will cover the tablet. Pt req that the new RX be resent to   [facility]  tiZANidine (ZANAFLEX) 4 MG capsule

## 2018-02-07 NOTE — Addendum Note (Signed)
Addended by: Jacqualin Combes on: 02/07/2018 11:17 AM   Modules accepted: Orders

## 2018-02-10 ENCOUNTER — Telehealth: Payer: Self-pay | Admitting: Internal Medicine

## 2018-02-10 DIAGNOSIS — H401231 Low-tension glaucoma, bilateral, mild stage: Secondary | ICD-10-CM | POA: Diagnosis not present

## 2018-02-10 NOTE — Telephone Encounter (Signed)
Copied from [place] [id number]. Topic: Quick Communication - Rx Refill/Question >> Feb 10, 2018  5:45 PM Selinda Flavin B, NT wrote: Medication: rosuvastatin (CRESTOR) 40 MG tablet Has the patient contacted their pharmacy? Yes.   (Agent: If no, request that the patient contact the pharmacy for the refill.) Preferred Pharmacy (with phone number or street name): [id number] [place], [place]: Please be advised that RX refills may take up to 3 business days. We ask that you follow-up with your pharmacy.

## 2018-02-10 NOTE — Addendum Note (Signed)
Addended by: Clarene Reamer B on: 02/10/2018 02:01 PM   Modules accepted: Orders

## 2018-02-10 NOTE — Telephone Encounter (Signed)
lmom for pt to call about lab results. Please send certified letter.

## 2018-02-13 NOTE — Telephone Encounter (Signed)
PA has been submitted via covermymeds, awaiting response from insurance company

## 2018-02-13 NOTE — Telephone Encounter (Signed)
Medication has been approved through 02/13/2019 from [facility]

## 2018-02-13 NOTE — Telephone Encounter (Signed)
Returned call to pt.  Stated he has only been able to receive 10 tablets at a time of the Rosuvastatin, as the Pharmacy is stating this needs to be prior authorized.  Questioned pt. about his leg symptoms.  Reported the muscle aching stopped, when he held the Rosuvastatin for 2 weeks.  Reported he resumed it, after the 2 week period, and has not had any reoccurence of the leg aching.     [facility].  Confirmed they are still awaiting the Prior Authorization for Rosuvastatin.  Stated she will fax another PA form to [employer] office.

## 2018-02-14 NOTE — Telephone Encounter (Signed)
Certified letter sent. Tracking #- [id number].

## 2018-02-17 ENCOUNTER — Ambulatory Visit: Payer: Medicare HMO | Attending: Internal Medicine

## 2018-02-17 DIAGNOSIS — G4761 Periodic limb movement disorder: Secondary | ICD-10-CM | POA: Insufficient documentation

## 2018-02-17 DIAGNOSIS — G4733 Obstructive sleep apnea (adult) (pediatric): Secondary | ICD-10-CM | POA: Insufficient documentation

## 2018-02-20 DIAGNOSIS — R700 Elevated erythrocyte sedimentation rate: Secondary | ICD-10-CM | POA: Diagnosis not present

## 2018-02-20 DIAGNOSIS — R51 Headache: Secondary | ICD-10-CM | POA: Diagnosis not present

## 2018-02-24 DIAGNOSIS — G4733 Obstructive sleep apnea (adult) (pediatric): Secondary | ICD-10-CM | POA: Diagnosis not present

## 2018-03-01 ENCOUNTER — Telehealth: Payer: Self-pay | Admitting: *Deleted

## 2018-03-01 NOTE — Telephone Encounter (Signed)
Pt does not have voicemail: attempted to call with results.  Auto-cpap with pressure range of 10-16 cmH20 Date of study: 02/17/18

## 2018-03-02 NOTE — Telephone Encounter (Signed)
Voicemail box not set up yet. 2nd message.

## 2018-03-03 ENCOUNTER — Encounter: Payer: Self-pay | Admitting: *Deleted

## 2018-03-03 NOTE — Telephone Encounter (Signed)
Attempted to call pt with results. No answer and no VM available. Will mail letter.

## 2018-03-08 ENCOUNTER — Other Ambulatory Visit: Payer: Self-pay | Admitting: Family Medicine

## 2018-03-08 ENCOUNTER — Other Ambulatory Visit: Payer: Medicare HMO

## 2018-03-08 DIAGNOSIS — R972 Elevated prostate specific antigen [PSA]: Secondary | ICD-10-CM

## 2018-03-09 LAB — PSA: Prostate Specific Ag, Serum: 6.7 ng/mL — ABNORMAL HIGH (ref 0.0–4.0)

## 2018-03-10 ENCOUNTER — Encounter: Payer: Self-pay | Admitting: Urology

## 2018-03-10 ENCOUNTER — Ambulatory Visit: Payer: Medicare HMO | Admitting: Urology

## 2018-03-10 VITALS — BP 117/72 | HR 73 | Resp 16 | Ht 74.0 in | Wt 268.4 lb

## 2018-03-10 DIAGNOSIS — R972 Elevated prostate specific antigen [PSA]: Secondary | ICD-10-CM | POA: Diagnosis not present

## 2018-03-10 DIAGNOSIS — N401 Enlarged prostate with lower urinary tract symptoms: Secondary | ICD-10-CM | POA: Diagnosis not present

## 2018-03-10 LAB — URINALYSIS, COMPLETE
Bilirubin, UA: NEGATIVE
Glucose, UA: NEGATIVE
Leukocytes, UA: NEGATIVE
NITRITE UA: NEGATIVE
PH UA: 5.5 (ref 5.0–7.5)
Protein, UA: NEGATIVE
Specific Gravity, UA: 1.025 (ref 1.005–1.030)
Urobilinogen, Ur: 2 mg/dL — ABNORMAL HIGH (ref 0.2–1.0)

## 2018-03-10 LAB — MICROSCOPIC EXAMINATION
Epithelial Cells (non renal): NONE SEEN /hpf (ref 0–10)
WBC UA: NONE SEEN /HPF (ref 0–5)

## 2018-03-10 MED ORDER — TAMSULOSIN HCL 0.4 MG PO CAPS: 0.4000 mg | ORAL_CAPSULE | Freq: Every day | ORAL | 11 refills | Status: AC

## 2018-03-10 NOTE — Progress Notes (Signed)
03/10/2018 1:35 PM   Timothy Alvarado 1946-05-06 [id number]  Referring provider: Jearld Fenton, NP [address] [place], [place] [address]  Chief Complaint  Patient presents with  . Follow-up    HPI: 72 year old male presents for follow-up of an elevated PSA.  He was initially seen on 01/25/2018 for an elevated PSA of 7.06.  He had moderate lower urinary tract symptoms and a history of prostatitis.  His urinalysis was unremarkable.  He was started on tamsulosin and has noted significant improvement in his voiding pattern.  A repeat PSA on 03/08/2018 was persistently elevated at 6.7.   PMH: Past Medical History:  Diagnosis Date  . Allergy   . GERD (gastroesophageal reflux disease)   . Glaucoma   . Hyperlipidemia     Surgical History: Past Surgical History:  Procedure Laterality Date  . BACK SURGERY     lumbar   . ROTATOR CUFF REPAIR Left     Home Medications:  Allergies as of 03/10/2018      Reactions   Percocet [oxycodone-acetaminophen] Other (See Comments)   hallucinations      Medication List        Accurate as of 03/10/18  1:35 PM. Always use your most recent med list.          acetaminophen 500 MG tablet Commonly known as:  TYLENOL Take 1 tablet (500 mg total) by mouth every 6 (six) hours as needed.   aspirin 81 MG tablet Take 81 mg by mouth daily as needed for pain.   aspirin-acetaminophen-caffeine 250-250-65 MG tablet Commonly known as:  EXCEDRIN MIGRAINE Take 2 tablets by mouth every 6 (six) hours as needed for headache.   bimatoprost 0.01 % Soln Commonly known as:  LUMIGAN Place 1 drop into both eyes at bedtime.   fluticasone 50 MCG/ACT nasal spray Commonly known as:  FLONASE Place 2 sprays into both nostrils daily.   naproxen 500 MG tablet Commonly known as:  NAPROSYN Take 500 mg by mouth 2 (two) times daily with a meal.   pantoprazole 40 MG tablet Commonly known as:  PROTONIX Take 1 tablet (40 mg total) by mouth daily.     pregabalin 100 MG capsule Commonly known as:  LYRICA Take 1 capsule (100 mg total) by mouth 2 (two) times daily.   ranitidine 150 MG tablet Commonly known as:  ZANTAC Take 150 mg by mouth daily as needed for heartburn.   rosuvastatin 40 MG tablet Commonly known as:  CRESTOR Take 1 tablet (40 mg total) by mouth daily.   tamsulosin 0.4 MG Caps capsule Commonly known as:  FLOMAX Take 1 capsule (0.4 mg total) by mouth daily.   tiZANidine 4 MG tablet Commonly known as:  ZANAFLEX Take 1 tablet (4 mg total) by mouth 3 (three) times daily as needed for muscle spasms.   vitamin B-12 100 MCG tablet Commonly known as:  CYANOCOBALAMIN Take 100 mcg by mouth daily.       Allergies:  Allergies  Allergen Reactions  . Percocet [Oxycodone-Acetaminophen] Other (See Comments)    hallucinations    Family History: Family History  Problem Relation Age of Onset  . Breast cancer Sister   . Prostate cancer Brother   . Hyperlipidemia Brother     Social History:  reports that he has never smoked. He has never used smokeless tobacco. He reports that he drinks alcohol. He reports that he does not use drugs.  ROS: UROLOGY Frequent Urination?: Yes Hard to postpone urination?: No Burning/pain  with urination?: No Get up at night to urinate?: Yes Leakage of urine?: No Urine stream starts and stops?: No Trouble starting stream?: No Do you have to strain to urinate?: No Blood in urine?: No Urinary tract infection?: No Sexually transmitted disease?: No Injury to kidneys or bladder?: No Painful intercourse?: No Weak stream?: Yes Erection problems?: No Penile pain?: No  Gastrointestinal Nausea?: No Vomiting?: Yes Indigestion/heartburn?: No Diarrhea?: No Constipation?: No  Constitutional Fever: No Night sweats?: No Weight loss?: No Fatigue?: No  Skin Skin rash/lesions?: No Itching?: No  Eyes Blurred vision?: Yes Double vision?: No  Ears/Nose/Throat Sore throat?:  Yes Sinus problems?: Yes  Hematologic/Lymphatic Swollen glands?: No Easy bruising?: No  Cardiovascular Leg swelling?: No Chest pain?: No  Respiratory Cough?: No Shortness of breath?: Yes  Endocrine Excessive thirst?: No  Musculoskeletal Back pain?: No Joint pain?: No  Neurological Headaches?: Yes Dizziness?: No  Psychologic Depression?: No Anxiety?: No  Physical Exam: BP 117/72   Pulse 73   Resp 16   Ht 6\' 2"  (1.88 m)   Wt 268 lb 6.4 oz (121.7 kg)   SpO2 98%   BMI 34.46 kg/m   Constitutional:  Alert and oriented, No acute distress.  Laboratory Data: Lab Results  Component Value Date   WBC 5.6 12/26/2017   HGB 13.7 12/26/2017   HCT 41.2 12/26/2017   MCV 90.8 12/26/2017   PLT 265.0 12/26/2017    Lab Results  Component Value Date   CREATININE 1.18 12/26/2017    Lab Results  Component Value Date   PSA 7.06 (H) 12/26/2017     Assessment & Plan:    1. Elevated PSA As per my previous note we discussed potential causes of an elevated PSA and the possibility of prostate cancer.  I discussed prostate biopsy and he was informed this is the only way to diagnose prostate cancer.  I also discussed adjunctive blood testing if he was undecided on whether he wanted to proceed with biopsy.  He would like to think over these options and will call back if he wants to pursue prostate biopsy versus 4Kscore.  2. Benign prostatic hyperplasia with lower urinary tract symptoms, symptom details unspecified Significant improvement in his voiding pattern on tamsulosin which was refilled.    Abbie Sons, [place] [address] [place], [place] [address] [phone]

## 2018-03-16 ENCOUNTER — Encounter: Payer: Self-pay | Admitting: Internal Medicine

## 2018-03-16 ENCOUNTER — Ambulatory Visit: Payer: Medicare HMO | Admitting: Internal Medicine

## 2018-03-16 VITALS — BP 116/72 | HR 84 | Temp 98.3°F | Wt 271.0 lb

## 2018-03-16 DIAGNOSIS — R102 Pelvic and perineal pain: Secondary | ICD-10-CM

## 2018-03-16 DIAGNOSIS — R972 Elevated prostate specific antigen [PSA]: Secondary | ICD-10-CM | POA: Diagnosis not present

## 2018-03-16 LAB — POC URINALSYSI DIPSTICK (AUTOMATED)
BILIRUBIN UA: NEGATIVE
Glucose, UA: NEGATIVE
Ketones, UA: NEGATIVE
LEUKOCYTES UA: NEGATIVE
NITRITE UA: NEGATIVE
PH UA: 6 (ref 5.0–8.0)
PROTEIN UA: NEGATIVE
RBC UA: NEGATIVE
Spec Grav, UA: >=1.03 — AB (ref 1.010–1.025)
UROBILINOGEN UA: 0.2 U/dL

## 2018-03-16 MED ORDER — CIPROFLOXACIN HCL 500 MG PO TABS: 500.0000 mg | ORAL_TABLET | Freq: Two times a day (BID) | ORAL | 0 refills | Status: DC

## 2018-03-16 NOTE — Addendum Note (Signed)
Addended by: Lurlean Nanny on: 03/16/2018 04:55 PM   Modules accepted: Orders

## 2018-03-16 NOTE — Progress Notes (Signed)
Subjective:    Patient ID: Timothy Alvarado, male    DOB: 04/23/1946, 72 y.o.   MRN: [id number]  HPI  Pt presents to the clinic today with c/o perineal pain. He reports this is an ongoing issue for the last 5 months. He describes the pain as sharp. The pain does not radiate does but he reports he does have some tingling in his testicles. He denies urgency, frequency, dysuria, blood in his urine, testicular pain and swelling. He is seeing urology for the same, last seen 5/10. He has a follow up appt in 1 month. He is taking Flomax as prescribed for BPH with LUTS.  Review of Systems      Past Medical History:  Diagnosis Date  . Allergy   . GERD (gastroesophageal reflux disease)   . Glaucoma   . Hyperlipidemia     Current Outpatient Medications  Medication Sig Dispense Refill  . acetaminophen (TYLENOL) 500 MG tablet Take 1 tablet (500 mg total) by mouth every 6 (six) hours as needed. 30 tablet 0  . aspirin 81 MG tablet Take 81 mg by mouth daily as needed for pain.     Marland Kitchen aspirin-acetaminophen-caffeine (EXCEDRIN MIGRAINE) 250-250-65 MG tablet Take 2 tablets by mouth every 6 (six) hours as needed for headache.    . bimatoprost (LUMIGAN) 0.01 % SOLN Place 1 drop into both eyes at bedtime.    . fluticasone (FLONASE) 50 MCG/ACT nasal spray Place 2 sprays into both nostrils daily. 16 g 3  . naproxen (NAPROSYN) 500 MG tablet Take 500 mg by mouth 2 (two) times daily with a meal.    . pantoprazole (PROTONIX) 40 MG tablet Take 1 tablet (40 mg total) by mouth daily. 30 tablet 2  . pregabalin (LYRICA) 100 MG capsule Take 1 capsule (100 mg total) by mouth 2 (two) times daily. (Patient taking differently: Take 100 mg by mouth daily as needed (diabetic nerve pain). ) 60 capsule 0  . ranitidine (ZANTAC) 150 MG tablet Take 150 mg by mouth daily as needed for heartburn.    . rosuvastatin (CRESTOR) 40 MG tablet Take 1 tablet (40 mg total) by mouth daily. 30 tablet 2  . tamsulosin (FLOMAX) 0.4 MG CAPS capsule  Take 1 capsule (0.4 mg total) by mouth daily. 30 capsule 11  . tiZANidine (ZANAFLEX) 4 MG tablet Take 1 tablet (4 mg total) by mouth 3 (three) times daily as needed for muscle spasms. 20 tablet 0  . vitamin B-12 (CYANOCOBALAMIN) 100 MCG tablet Take 100 mcg by mouth daily.     No current facility-administered medications for this visit.     Allergies  Allergen Reactions  . Percocet [Oxycodone-Acetaminophen] Other (See Comments)    hallucinations    Family History  Problem Relation Age of Onset  . Breast cancer Sister   . Prostate cancer Brother   . Hyperlipidemia Brother     Social History   Socioeconomic History  . Marital status: Married    Spouse name: Not on file  . Number of children: Not on file  . Years of education: Not on file  . Highest education level: Not on file  Occupational History  . Not on file  Social Needs  . Financial resource strain: Not on file  . Food insecurity:    Worry: Not on file    Inability: Not on file  . Transportation needs:    Medical: Not on file    Non-medical: Not on file  Tobacco Use  . Smoking status:  Never Smoker  . Smokeless tobacco: Never Used  Substance and Sexual Activity  . Alcohol use: Yes    Comment: occasional beer  . Drug use: No  . Sexual activity: Never  Lifestyle  . Physical activity:    Days per week: Not on file    Minutes per session: Not on file  . Stress: Not on file  Relationships  . Social connections:    Talks on phone: Not on file    Gets together: Not on file    Attends religious service: Not on file    Active member of club or organization: Not on file    Attends meetings of clubs or organizations: Not on file    Relationship status: Not on file  . Intimate partner violence:    Fear of current or ex partner: Not on file    Emotionally abused: Not on file    Physically abused: Not on file    Forced sexual activity: Not on file  Other Topics Concern  . Not on file  Social History Narrative    . Not on file     Constitutional: Denies fever, malaise, fatigue, headache or abrupt weight changes.  Gastrointestinal: Denies abdominal pain, bloating, constipation, diarrhea or blood in the stool.  GU: Pt reports perineal pain. Denies urgency, frequency, pain with urination, burning sensation, blood in urine, odor or discharge.  No other specific complaints in a complete review of systems (except as listed in HPI above).  Objective:   Physical Exam   BP 116/72   Pulse 84   Temp 98.3 F (36.8 C) (Oral)   Wt 271 lb (122.9 kg)   SpO2 96%   BMI 34.79 kg/m  Wt Readings from Last 3 Encounters:  03/16/18 271 lb (122.9 kg)  03/10/18 268 lb 6.4 oz (121.7 kg)  02/06/18 266 lb 12 oz (121 kg)    General: Appears his stated age, obese in NAD. Abdomen: Soft and nontender. Normal bowel sounds. No distention or masses noted.  Prostate Exam: Deferred GU: Normal male anatomy. Testicles not swollen or erythematous. Non tender with palpation.  BMET    Component Value Date/Time   NA 139 12/26/2017 1005   K 4.0 12/26/2017 1005   CL 103 12/26/2017 1005   CO2 29 12/26/2017 1005   GLUCOSE 103 (H) 12/26/2017 1005   BUN 15 12/26/2017 1005   CREATININE 1.18 12/26/2017 1005   CALCIUM 9.3 12/26/2017 1005   GFRNONAA >60 10/24/2017 0910   GFRAA >60 10/24/2017 0910    Lipid Panel     Component Value Date/Time   CHOL 318 (H) 12/26/2017 1005   TRIG 74.0 12/26/2017 1005   HDL 46.90 12/26/2017 1005   CHOLHDL 7 12/26/2017 1005   VLDL 14.8 12/26/2017 1005   LDLCALC 256 (H) 12/26/2017 1005    CBC    Component Value Date/Time   WBC 5.6 12/26/2017 1005   RBC 4.54 12/26/2017 1005   HGB 13.7 12/26/2017 1005   HCT 41.2 12/26/2017 1005   PLT 265.0 12/26/2017 1005   MCV 90.8 12/26/2017 1005   MCH 30.0 10/24/2017 0910   MCHC 33.2 12/26/2017 1005   RDW 16.1 (H) 12/26/2017 1005    Hgb A1C Lab Results  Component Value Date   HGBA1C 6.3 12/26/2017           Assessment & Plan:    Perineal Pain, Elevated PSA:  Urinalysis: normal No need to send urine cultura He is very concerned about prostate infection, I don't think  this is the case but will send in Cipro 500 mg BID x 14 days Push fluids Follow up with urology as scheduled  Return precautions discussed Webb Silversmith, NP

## 2018-03-16 NOTE — Patient Instructions (Signed)
Prostate-Specific Antigen Test Why am I having this test? The prostate-specific antigen (PSA) test is performed to determine how much PSA you have in your blood. PSA is a type of protein that is normally present in the prostate gland. Certain conditions can cause PSA blood levels to increase, such as:  Infection in the prostate (prostatitis).  Enlargement of the prostate (hypertrophy).  Prostate cancer.  Because PSA levels increase greatly from prostate cancer, this test can be used to confirm a diagnosis of prostate cancer. It may also be used to monitor treatment for prostate cancer and to watch for a return of prostate cancer after treatment has finished. This test has a very high false-positive rate. Therefore, routine PSA screening for all men is no longer recommended. A false-positive result is incorrect because it indicates a condition or finding is present when it is not. What kind of sample is taken? A blood sample is required for this test. It is usually collected by inserting a needle into a vein or by sticking a finger with a small needle. How do I prepare for this test? There is no preparation required for this test. However, there are factors that can affect the results of a PSA test. To get the most accurate results:  Avoid having a rectal exam within several hours before having your blood drawn for this test.  Avoid having any procedures performed on the prostate gland within 6 weeks of having this test.  Avoid ejaculating within 24 hours of having this test.  Tell your health care provider if you had a recent urinary tract infection (UTI).  Tell your health care provider if you are taking medicines to assist with hair growth, such as finasteride.  Tell your health care provider if you have been exposed to a medicine called diethylstilbestrol.  Let your health care provider know if any of these factors apply to you. You may be asked to reschedule the test. What are the  reference ranges? Reference ranges are established after testing a large group of people. Reference ranges may vary among different people, labs, and hospitals. It is your responsibility to obtain your test results. Ask the lab or department performing the test when and how you will get your results.  Low: 0-2.5 ng/mL.  Slightly to moderately elevated: 2.6-10.0 ng/mL.  Moderately elevated: 10.0-19.9 ng/mL.  Significantly elevated: 20 ng/mL or greater.  What do the results mean? PSA test results greater than 4 ng/mL are found in the majority of men with prostate cancer. If your test result is above this level, this can indicate an increased risk for prostate cancer. Increased PSA levels can also indicate other health conditions. Talk with your health care provider to discuss your results, treatment options, and if necessary, the need for more tests. Talk with your health care provider if you have any questions about your results. Talk with your health care provider to discuss your results, treatment options, and if necessary, the need for more tests. Talk with your health care provider if you have any questions about your results. This information is not intended to replace advice given to you by your health care provider. Make sure you discuss any questions you have with your health care provider. Document Released: 11/20/2004 Document Revised: 06/23/2016 Document Reviewed: 03/13/2014 Elsevier Interactive Patient Education  2018 [employer]vier Inc[employer]  

## 2018-03-28 ENCOUNTER — Other Ambulatory Visit: Payer: Self-pay | Admitting: Internal Medicine

## 2018-03-29 DIAGNOSIS — M79672 Pain in left foot: Secondary | ICD-10-CM | POA: Diagnosis not present

## 2018-03-29 DIAGNOSIS — B351 Tinea unguium: Secondary | ICD-10-CM | POA: Diagnosis not present

## 2018-03-29 DIAGNOSIS — M79674 Pain in right toe(s): Secondary | ICD-10-CM | POA: Diagnosis not present

## 2018-03-29 DIAGNOSIS — M722 Plantar fascial fibromatosis: Secondary | ICD-10-CM | POA: Diagnosis not present

## 2018-03-29 DIAGNOSIS — M79675 Pain in left toe(s): Secondary | ICD-10-CM | POA: Diagnosis not present

## 2018-03-29 DIAGNOSIS — M79671 Pain in right foot: Secondary | ICD-10-CM | POA: Diagnosis not present

## 2018-03-29 NOTE — Telephone Encounter (Signed)
Last filled 08/2017... Please advise

## 2018-04-07 ENCOUNTER — Other Ambulatory Visit: Payer: Self-pay | Admitting: Internal Medicine

## 2018-04-07 DIAGNOSIS — E7800 Pure hypercholesterolemia, unspecified: Secondary | ICD-10-CM

## 2018-04-12 ENCOUNTER — Ambulatory Visit (INDEPENDENT_AMBULATORY_CARE_PROVIDER_SITE_OTHER): Payer: Medicare HMO | Admitting: Urology

## 2018-04-12 ENCOUNTER — Encounter

## 2018-04-12 ENCOUNTER — Encounter: Payer: Self-pay | Admitting: Urology

## 2018-04-12 VITALS — BP 144/79 | HR 94 | Resp 16 | Ht 73.0 in | Wt 270.8 lb

## 2018-04-12 DIAGNOSIS — N401 Enlarged prostate with lower urinary tract symptoms: Secondary | ICD-10-CM

## 2018-04-12 DIAGNOSIS — R3129 Other microscopic hematuria: Secondary | ICD-10-CM | POA: Diagnosis not present

## 2018-04-12 LAB — MICROSCOPIC EXAMINATION
EPITHELIAL CELLS (NON RENAL): NONE SEEN /HPF (ref 0–10)
WBC, UA: NONE SEEN /hpf (ref 0–5)

## 2018-04-12 LAB — URINALYSIS, COMPLETE
BILIRUBIN UA: NEGATIVE
Glucose, UA: NEGATIVE
LEUKOCYTES UA: NEGATIVE
Nitrite, UA: NEGATIVE
PH UA: 5.5 (ref 5.0–7.5)
PROTEIN UA: NEGATIVE
Specific Gravity, UA: 1.025 (ref 1.005–1.030)
Urobilinogen, Ur: 4 mg/dL — ABNORMAL HIGH (ref 0.2–1.0)

## 2018-04-12 NOTE — Progress Notes (Signed)
04/12/2018 2:37 PM   Timothy Alvarado 1945/11/17 [id number]  Referring provider: Jearld Fenton, NP [address] [place], [place] [address]  Chief Complaint  Patient presents with  . Pelvic Pain    HPI: 72 year old male presents for follow-up.  He is pleased to been seen for lower urinary tract symptoms and an elevated PSA.  He was seen in early May and his voiding symptoms had improved on tamsulosin.  His PSA remained elevated and he was going to make a decision on prostate biopsy.  He saw his primary provider 1 week later complaining of 68-month history of perineal pain which he did not mention to me.  His urinalysis was unremarkable.  He was started on Cipro however discontinued the medication secondary to lower extremity pain.  He remains on tamsulosin. He complains of perineal pain that radiates to the scrotal area.   PMH: Past Medical History:  Diagnosis Date  . Allergy   . GERD (gastroesophageal reflux disease)   . Glaucoma   . Hyperlipidemia     Surgical History: Past Surgical History:  Procedure Laterality Date  . BACK SURGERY     lumbar   . ROTATOR CUFF REPAIR Left     Home Medications:  Allergies as of 04/12/2018      Reactions   Percocet [oxycodone-acetaminophen] Other (See Comments)   hallucinations      Medication List        Accurate as of 04/12/18  2:37 PM. Always use your most recent med list.          acetaminophen 500 MG tablet Commonly known as:  TYLENOL Take 1 tablet (500 mg total) by mouth every 6 (six) hours as needed.   aspirin 81 MG tablet Take 81 mg by mouth daily as needed for pain.   aspirin-acetaminophen-caffeine 250-250-65 MG tablet Commonly known as:  EXCEDRIN MIGRAINE Take 2 tablets by mouth every 6 (six) hours as needed for headache.   bimatoprost 0.01 % Soln Commonly known as:  LUMIGAN Place 1 drop into both eyes at bedtime.   fluticasone 50 MCG/ACT nasal spray Commonly known as:  FLONASE Place 2 sprays into both  nostrils daily.   latanoprost 0.005 % ophthalmic solution Commonly known as:  XALATAN INSTILL 1 DROP INTO AFFECTED EYE EVERY DAY AS DIRECTED   naproxen 500 MG tablet Commonly known as:  NAPROSYN Take 500 mg by mouth 2 (two) times daily with a meal.   pantoprazole 40 MG tablet Commonly known as:  PROTONIX Take 1 tablet (40 mg total) by mouth daily.   pregabalin 100 MG capsule Commonly known as:  LYRICA Take 1 capsule (100 mg total) by mouth 2 (two) times daily.   ranitidine 150 MG tablet Commonly known as:  ZANTAC Take 150 mg by mouth daily as needed for heartburn.   rosuvastatin 40 MG tablet Commonly known as:  CRESTOR Take 1 tablet (40 mg total) by mouth daily. MUST SCHEDULE LAB VISIT   tamsulosin 0.4 MG Caps capsule Commonly known as:  FLOMAX Take 1 capsule (0.4 mg total) by mouth daily.   tiZANidine 4 MG tablet Commonly known as:  ZANAFLEX Take 1 tablet (4 mg total) by mouth 3 (three) times daily as needed for muscle spasms.   vitamin B-12 100 MCG tablet Commonly known as:  CYANOCOBALAMIN Take 100 mcg by mouth daily.       Allergies:  Allergies  Allergen Reactions  . Percocet [Oxycodone-Acetaminophen] Other (See Comments)    hallucinations  Family History: Family History  Problem Relation Age of Onset  . Breast cancer Sister   . Prostate cancer Brother   . Hyperlipidemia Brother     Social History:  reports that he has never smoked. He has never used smokeless tobacco. He reports that he drinks alcohol. He reports that he does not use drugs.  ROS: UROLOGY Frequent Urination?: Yes Hard to postpone urination?: No Burning/pain with urination?: No Get up at night to urinate?: Yes Leakage of urine?: Yes Urine stream starts and stops?: No Trouble starting stream?: No Do you have to strain to urinate?: No Blood in urine?: No Urinary tract infection?: No Sexually transmitted disease?: No Injury to kidneys or bladder?: No Painful intercourse?:  No Weak stream?: No Erection problems?: No Penile pain?: Yes  Gastrointestinal Nausea?: No Vomiting?: No Indigestion/heartburn?: No Diarrhea?: No Constipation?: No  Constitutional Fever: No Night sweats?: No Weight loss?: No Fatigue?: Yes  Skin Skin rash/lesions?: No Itching?: No  Eyes Blurred vision?: No Double vision?: No  Ears/Nose/Throat Sore throat?: No Sinus problems?: No  Hematologic/Lymphatic Swollen glands?: No Easy bruising?: No  Cardiovascular Leg swelling?: Yes Chest pain?: No  Respiratory Cough?: No Shortness of breath?: No  Endocrine Excessive thirst?: No  Musculoskeletal Back pain?: No Joint pain?: No  Neurological Headaches?: No Dizziness?: No  Psychologic Depression?: No Anxiety?: No  Physical Exam: BP (!) 144/79   Pulse 94   Resp 16   Ht 6\' 1"  (1.854 m)   Wt 270 lb 12.8 oz (122.8 kg)   SpO2 96%   BMI 35.73 kg/m   Constitutional:  Alert and oriented, No acute distress. HEENT: [place] AT, moist mucus membranes.  Trachea midline, no masses. Cardiovascular: No clubbing, cyanosis, or edema. Respiratory: Normal respiratory effort, no increased work of breathing. GI: Abdomen is soft, nontender, nondistended, no abdominal masses GU: No CVA tenderness.  No perineal tenderness, induration, mass or fluctuance.  Testes descended bilateral without masses or tenderness.  Prostate 40 g, smooth without nodules or tenderness. Lymph: No cervical or inguinal lymphadenopathy. Skin: No rashes, bruises or suspicious lesions. Neurologic: Grossly intact, no focal deficits, moving all 4 extremities. Psychiatric: Normal mood and affect.   Assessment & Plan:   Urinalysis today with significant microhematuria 3-10 RBCs.  A urine culture was ordered.  Have recommended scheduling a CT urogram and cystoscopy.  He inquired about prostate biopsy and whether this can be performed under sedation.  If the CT urogram is unremarkable will schedule cystoscopy and  prostate biopsy under sedation.   Abbie Sons, [place] [address] [place],  [address] [phone]

## 2018-04-14 LAB — CULTURE, URINE COMPREHENSIVE

## 2018-04-16 ENCOUNTER — Encounter: Payer: Self-pay | Admitting: Urology

## 2018-04-21 ENCOUNTER — Other Ambulatory Visit: Payer: Self-pay | Admitting: Internal Medicine

## 2018-04-21 DIAGNOSIS — E7800 Pure hypercholesterolemia, unspecified: Secondary | ICD-10-CM

## 2018-04-22 MED ORDER — ROSUVASTATIN CALCIUM 40 MG PO TABS
40.0000 mg | ORAL_TABLET | Freq: Every day | ORAL | 0 refills | Status: DC
Start: 2018-04-22 — End: 2018-06-01

## 2018-04-22 NOTE — Addendum Note (Signed)
Addended by: Lurlean Nanny on: 04/22/2018 09:40 AM   Modules accepted: Orders

## 2018-04-26 ENCOUNTER — Ambulatory Visit
Admission: RE | Admit: 2018-04-26 | Discharge: 2018-04-26 | Disposition: A | Discharge status: Home / Self Care | Payer: Medicare HMO | Source: Ambulatory Visit | Attending: Urology | Admitting: Urology

## 2018-04-26 DIAGNOSIS — K769 Liver disease, unspecified: Secondary | ICD-10-CM | POA: Diagnosis not present

## 2018-04-26 DIAGNOSIS — R3129 Other microscopic hematuria: Secondary | ICD-10-CM

## 2018-04-26 DIAGNOSIS — K5730 Diverticulosis of large intestine without perforation or abscess without bleeding: Secondary | ICD-10-CM | POA: Insufficient documentation

## 2018-04-26 DIAGNOSIS — R160 Hepatomegaly, not elsewhere classified: Secondary | ICD-10-CM | POA: Insufficient documentation

## 2018-04-26 LAB — POCT I-STAT CREATININE: CREATININE: 1 mg/dL (ref 0.61–1.24)

## 2018-04-26 MED ORDER — IOPAMIDOL (ISOVUE-370) INJECTION 76%
100.0000 mL | Freq: Once | INTRAVENOUS | Status: AC | PRN
  Administered 2018-04-26: 100 mL via INTRAVENOUS

## 2018-04-28 ENCOUNTER — Ambulatory Visit: Payer: Self-pay | Admitting: *Deleted

## 2018-04-28 ENCOUNTER — Ambulatory Visit: Payer: Medicare HMO | Admitting: Internal Medicine

## 2018-04-28 ENCOUNTER — Telehealth: Payer: Self-pay

## 2018-04-28 DIAGNOSIS — J019 Acute sinusitis, unspecified: Secondary | ICD-10-CM | POA: Diagnosis not present

## 2018-04-28 DIAGNOSIS — R51 Headache: Secondary | ICD-10-CM | POA: Diagnosis not present

## 2018-04-28 NOTE — Telephone Encounter (Signed)
Pt informed, he would like to come in for an office visit to discuss this with dr. Bernardo Heater, please schedule.

## 2018-04-28 NOTE — Telephone Encounter (Signed)
His jaw pain is on the right. And also has tingling sometimes with the right leg.

## 2018-04-28 NOTE — Telephone Encounter (Signed)
-----   Message from Abbie Sons, MD sent at 04/27/2018  1:24 PM EDT ----- CT urogram does not show any genitourinary abnormalities.  There is a mass in his liver which is most likely benign however will need further evaluation with an MRI.  I recommended scheduling cystoscopy at his 6/12 office visit however it does not look like this has yet been scheduled.

## 2018-04-28 NOTE — Telephone Encounter (Signed)
Per triage note pt going to UC today and was scheduled 30' appt with Glenda Chroman FNP on 05/01/18.

## 2018-04-28 NOTE — Telephone Encounter (Signed)
Pt called with several symptoms, headache, some nausea or upset stomach, sore throat, chest soreness when he pushes on his chest. He denies having a fever., or vomiting. He stated his b/p had been elevated in the office (was 144/79 on 04/12/18). He was vague about all his c/o's. Had CT scan on Wednesday of abd and pelvis. He is concerned that it may be the contrast dye that is causing his symptoms. Per protocol, pt should be seen in 24 hours. Pt is going to urgent care to be seen. And he requested an appointment for Monday.  Will notify flow at [facility].  Home care advice given to patient with verbal understanding.  Reason for Disposition . [1] MODERATE headache (e.g., interferes with normal activities) AND [2] present > 24 hours AND [3] unexplained  (Exceptions: analgesics not tried, typical migraine, or headache part of viral illness)  Answer Assessment - Initial Assessment Questions 1. LOCATION: "Where does it hurt?"      Right side of head 2. ONSET: "When did the headache start?" (Minutes, hours or days)      yesterday 3. PATTERN: "Does the pain come and go, or has it been constant since it started?"     constant 4. SEVERITY: "How bad is the pain?" and "What does it keep you from doing?"  (e.g., Scale 1-10; mild, moderate, or severe)   - MILD (1-3): doesn't interfere with normal activities    - MODERATE (4-7): interferes with normal activities or awakens from sleep    - SEVERE (8-10): excruciating pain, unable to do any normal activities        Pain # 6 or 7 5. RECURRENT SYMPTOM: "Have you ever had headaches before?" If so, ask: "When was the last time?" and "What happened that time?"      Yes, had a shot once and took muscle relaxer and helped a little 6. CAUSE: "What do you think is causing the headache?"     no 7. MIGRAINE: "Have you been diagnosed with migraine headaches?" If so, ask: "Is this headache similar?"      no 8. HEAD INJURY: "Has there been any recent injury to the  head?"      no 9. OTHER SYMPTOMS: "Do you have any other symptoms?" (fever, stiff neck, eye pain, sore throat, cold symptoms)     A little neck stiffness on the right, pain over left eye, sore throat  Answer Assessment - Initial Assessment Questions 1. SYMPTOM: "What is the main symptom you are concerned about?" (e.g., weakness, numbness)     tingling 2. ONSET: "When did this start?" (minutes, hours, days; while sleeping)     yesterday 3. LAST NORMAL: "When was the last time you were normal (no symptoms)?"     Wednesday 4. PATTERN "Does this come and go, or has it been constant since it started?"  "Is it present now?"     Comes and goes and not present now 5. CARDIAC SYMPTOMS: "Have you had any of the following symptoms: chest pain, difficulty breathing, palpitations?"     none 6. NEUROLOGIC SYMPTOMS: "Have you had any of the following symptoms: headache, dizziness, vision loss, double vision, changes in speech, unsteady on your feet?"     Headache, a little bit of dizziness, a little bit of trouble walking 7. OTHER SYMPTOMS: "Do you have any other symptoms?"     no  Answer Assessment - Initial Assessment Questions 1. NAUSEA SEVERITY: "How bad is the nausea?" (e.g., mild, moderate, severe; dehydration,  weight loss)   - MILD: loss of appetite without change in eating habits   - MODERATE: decreased oral intake without significant weight loss, dehydration, or malnutrition   - SEVERE: inadequate caloric or fluid intake, significant weight loss, symptoms of dehydration     Mild, constantly hurts 2. ONSET: "When did the nausea begin?"     yesterday 3. VOMITING: "Any vomiting?" If so, ask: "How many times today?"     no 4. RECURRENT SYMPTOM: "Have you had nausea before?" If so, ask: "When was the last time?" "What happened that time?"     no 5. CAUSE: "What do you think is causing the nausea?"     Not sure  Protocols used: HEADACHE-A-AH, NEUROLOGIC DEFICIT-A-AH, NAUSEA-A-AH

## 2018-05-01 ENCOUNTER — Encounter: Payer: Self-pay | Admitting: Family Medicine

## 2018-05-01 ENCOUNTER — Ambulatory Visit: Payer: Medicare HMO | Admitting: Family Medicine

## 2018-05-01 VITALS — BP 102/62 | HR 85 | Temp 97.9°F | Ht 73.0 in | Wt 271.2 lb

## 2018-05-01 DIAGNOSIS — R51 Headache: Secondary | ICD-10-CM | POA: Diagnosis not present

## 2018-05-01 DIAGNOSIS — M25511 Pain in right shoulder: Secondary | ICD-10-CM

## 2018-05-01 DIAGNOSIS — R519 Headache, unspecified: Secondary | ICD-10-CM

## 2018-05-01 NOTE — Progress Notes (Signed)
Subjective:    Patient ID: Timothy Alvarado, male    DOB: Nov 07, 1945, 72 y.o.   MRN: [id number]  HPI This is a 72 yo male who presents today with headaches on right side and chills down right arm. This has been going on for about 5 days. Has felt a little warm. Seemed to have started after CT scan and received dye. He went to urgent care a couple of days ago and was told it might be from his sinuses. Taking flonase. No recent trauma or over use of neck or shoulder. Feels a little weak in arms and legs off and on. No nasal drainage, no sore throat, no ear pain. No chest pain or SOB.  Feels a lot better today. Has been taking Tylenol as needed. Had one dose yesterday.   Had recent CT scan for urology. Mass noted in liver, needs MRI liver protocol follow up. He will call today to see if urology will be scheduling. Will be having cystoscopy, a little anxious about this.   Past Medical History:  Diagnosis Date  . Allergy   . GERD (gastroesophageal reflux disease)   . Glaucoma   . Hyperlipidemia    Past Surgical History:  Procedure Laterality Date  . BACK SURGERY     lumbar   . ROTATOR CUFF REPAIR Left    Family History  Problem Relation Age of Onset  . Breast cancer Sister   . Prostate cancer Brother   . Hyperlipidemia Brother    Social History   Tobacco Use  . Smoking status: Never Smoker  . Smokeless tobacco: Never Used  Substance Use Topics  . Alcohol use: Yes    Comment: occasional beer  . Drug use: No      Review of Systems Per HPI    Objective:   Physical Exam  Constitutional: He is oriented to person, place, and time. He appears well-developed and well-nourished. No distress.  HENT:  Head: Normocephalic and atraumatic.  Mouth/Throat: Oropharynx is clear and moist.  Eyes: Pupils are equal, round, and reactive to light. EOM are normal.  Neck: Normal range of motion. Neck supple.  Cardiovascular: Normal rate, regular rhythm and normal heart sounds.  Pulmonary/Chest:  Effort normal and breath sounds normal.  Musculoskeletal:  UE/LE with full ROM, strength 5/5. Some tenderness of right shoulder with palpation.   Neurological: He is alert and oriented to person, place, and time. He has normal strength.  Psychiatric: He has a normal mood and affect. His behavior is normal.  Vitals reviewed.     BP 102/62 (BP Location: Left Arm, Patient Position: Sitting, Cuff Size: Large)   Pulse 85   Temp 97.9 F (36.6 C) (Oral)   Ht 6\' 1"  (1.854 m)   Wt 271 lb 4 oz (123 kg)   SpO2 98%   BMI 35.79 kg/m  Wt Readings from Last 3 Encounters:  05/01/18 271 lb 4 oz (123 kg)  04/12/18 270 lb 12.8 oz (122.8 kg)  03/16/18 271 lb (122.9 kg)       Assessment & Plan:  1. Nonintractable episodic headache, unspecified headache type - better today, discussed use of otc analgesics on occasion   2. Acute pain of right shoulder - no concerning findings on physical exam, OTC analgesics as needed, gentle stretching/ROM  - RTC precautions reviewed - he will call the office if MRI liver order needed    Clarene Reamer, FNP-BC  [place] [facility], [place]  05/01/2018 12:43  PM

## 2018-05-01 NOTE — Patient Instructions (Signed)
Good to see you today  Can take tylenol as needed for pain 2 to 3 times a day

## 2018-05-09 ENCOUNTER — Telehealth: Payer: Self-pay | Admitting: Urology

## 2018-05-09 ENCOUNTER — Ambulatory Visit: Payer: Medicare HMO

## 2018-05-09 NOTE — Telephone Encounter (Signed)
Patient has been scheduled ° ° °Timothy Alvarado °

## 2018-05-21 ENCOUNTER — Other Ambulatory Visit: Payer: Self-pay | Admitting: Internal Medicine

## 2018-05-21 ENCOUNTER — Other Ambulatory Visit: Payer: Self-pay

## 2018-05-21 ENCOUNTER — Encounter: Payer: Self-pay | Admitting: Emergency Medicine

## 2018-05-21 ENCOUNTER — Emergency Department
Admission: EM | Admit: 2018-05-21 | Discharge: 2018-05-21 | Disposition: A | Discharge status: Home / Self Care | Payer: Medicare HMO | Attending: Emergency Medicine | Admitting: Emergency Medicine

## 2018-05-21 DIAGNOSIS — R0902 Hypoxemia: Secondary | ICD-10-CM | POA: Diagnosis not present

## 2018-05-21 DIAGNOSIS — R531 Weakness: Secondary | ICD-10-CM | POA: Diagnosis not present

## 2018-05-21 DIAGNOSIS — M62838 Other muscle spasm: Secondary | ICD-10-CM

## 2018-05-21 DIAGNOSIS — I10 Essential (primary) hypertension: Secondary | ICD-10-CM | POA: Diagnosis not present

## 2018-05-21 DIAGNOSIS — M5489 Other dorsalgia: Secondary | ICD-10-CM | POA: Diagnosis present

## 2018-05-21 DIAGNOSIS — M62830 Muscle spasm of back: Secondary | ICD-10-CM | POA: Insufficient documentation

## 2018-05-21 DIAGNOSIS — Z79899 Other long term (current) drug therapy: Secondary | ICD-10-CM | POA: Diagnosis not present

## 2018-05-21 DIAGNOSIS — R9431 Abnormal electrocardiogram [ECG] [EKG]: Secondary | ICD-10-CM | POA: Diagnosis not present

## 2018-05-21 DIAGNOSIS — M549 Dorsalgia, unspecified: Secondary | ICD-10-CM | POA: Diagnosis not present

## 2018-05-21 LAB — URINALYSIS, COMPLETE (UACMP) WITH MICROSCOPIC
BACTERIA UA: NONE SEEN
BILIRUBIN URINE: NEGATIVE
Glucose, UA: NEGATIVE mg/dL
HGB URINE DIPSTICK: NEGATIVE
KETONES UR: NEGATIVE mg/dL
LEUKOCYTES UA: NEGATIVE
NITRITE: NEGATIVE
PH: 7 (ref 5.0–8.0)
Protein, ur: NEGATIVE mg/dL
SPECIFIC GRAVITY, URINE: 1.006 (ref 1.005–1.030)
Squamous Epithelial / LPF: NONE SEEN (ref 0–5)
WBC UA: NONE SEEN WBC/hpf (ref 0–5)

## 2018-05-21 LAB — COMPREHENSIVE METABOLIC PANEL
ALBUMIN: 3.8 g/dL (ref 3.5–5.0)
ALT: 31 U/L (ref 0–44)
ANION GAP: 6 (ref 5–15)
AST: 24 U/L (ref 15–41)
Alkaline Phosphatase: 50 U/L (ref 38–126)
BUN: 15 mg/dL (ref 8–23)
CALCIUM: 8.8 mg/dL — AB (ref 8.9–10.3)
CO2: 25 mmol/L (ref 22–32)
Chloride: 107 mmol/L (ref 98–111)
Creatinine, Ser: 1.07 mg/dL (ref 0.61–1.24)
GFR calc non Af Amer: >60 mL/min (ref >60)
GLUCOSE: 113 mg/dL — AB (ref 70–99)
POTASSIUM: 4.1 mmol/L (ref 3.5–5.1)
SODIUM: 138 mmol/L (ref 135–145)
TOTAL PROTEIN: 7.5 g/dL (ref 6.5–8.1)
Total Bilirubin: 1.1 mg/dL (ref 0.3–1.2)

## 2018-05-21 LAB — CBC
HCT: 41.2 % (ref 40.0–52.0)
Hemoglobin: 13.8 g/dL (ref 13.0–18.0)
MCH: 30.6 pg (ref 26.0–34.0)
MCHC: 33.5 g/dL (ref 32.0–36.0)
MCV: 91.4 fL (ref 80.0–100.0)
Platelets: 229 10*3/uL (ref 150–440)
RBC: 4.51 MIL/uL (ref 4.40–5.90)
RDW: 16.5 % — AB (ref 11.5–14.5)
WBC: 6.6 10*3/uL (ref 3.8–10.6)

## 2018-05-21 LAB — TROPONIN I: Troponin I: <0.03 ng/mL (ref <0.03)

## 2018-05-21 MED ORDER — TIZANIDINE HCL 4 MG PO TABS
4.0000 mg | ORAL_TABLET | Freq: Once | ORAL | Status: AC
  Administered 2018-05-21: 4 mg via ORAL
  Filled 2018-05-21: qty 1

## 2018-05-21 NOTE — Discharge Instructions (Signed)
Continue taking tizanidine as needed for muscle spasms.

## 2018-05-21 NOTE — ED Notes (Signed)
Pt ambulated to the bathroom unassisted to give urine sample. Instructed on clean cath specimen.

## 2018-05-21 NOTE — ED Notes (Signed)

## 2018-05-21 NOTE — ED Provider Notes (Addendum)
[facility] Emergency Department Provider Note  ____________________________________________  Time seen: Approximately 8:50 AM  I have reviewed the triage vital signs and the nursing notes.   HISTORY  Chief Complaint Back Pain    HPI Timothy Alvarado is a 72 y.o. male that presents to the emergency department for evaluation of back "burning and warmth" for 3 days. He has burning sensation from his neck through his low back and into both of his legs. He is unable to pinpoint any particular spot of pain. Yesterday was worse than today. Position does not change pain. Movement makes his back feel somewhat tight. He had similar symptoms in the past and was given Tizanidine, which helped. He has had 2 back surgeries in 1880s and 1890s. No bowel or bladder dysfunction or saddle paresthesias. No IV drug use.  He denies any sensations in his arms or hands.  No history of cancer.  No fever, chills, SOB, CP, nausea, vomiting, abdominal pain.    Past Medical History:  Diagnosis Date  . Allergy   . GERD (gastroesophageal reflux disease)   . Glaucoma   . Hyperlipidemia     Patient Active Problem List   Diagnosis Date Noted  . Elevated PSA 01/26/2018  . Benign prostatic hyperplasia with lower urinary tract symptoms 01/26/2018  . Seasonal allergies 05/10/2017  . GERD (gastroesophageal reflux disease) 05/10/2017  . HLD (hyperlipidemia) 05/10/2017  . Glaucoma 05/10/2017  . Borderline diabetes 05/10/2017  . Neuropathy 05/10/2017    Past Surgical History:  Procedure Laterality Date  . BACK SURGERY     lumbar   . ROTATOR CUFF REPAIR Left     Prior to Admission medications   Medication Sig Start Date End Date Taking? Authorizing Provider  acetaminophen (TYLENOL) 500 MG tablet Take 1 tablet (500 mg total) by mouth every 6 (six) hours as needed. 10/24/17   Shary Decamp, PA-C  aspirin 81 MG tablet Take 81 mg by mouth daily as needed for pain.     [provider]   aspirin-acetaminophen-caffeine (EXCEDRIN MIGRAINE) [phone] MG tablet Take 2 tablets by mouth every 6 (six) hours as needed for headache.    [provider]  bimatoprost (LUMIGAN) 0.01 % SOLN Place 1 drop into both eyes at bedtime.    [provider]  fluticasone (FLONASE) 50 MCG/ACT nasal spray Place 2 sprays into both nostrils daily. 01/20/18   Ria Bush, MD  latanoprost (XALATAN) 0.005 % ophthalmic solution INSTILL 1 DROP INTO AFFECTED EYE EVERY DAY AS DIRECTED 03/28/18   [provider]  naproxen (NAPROSYN) 500 MG tablet Take 500 mg by mouth 2 (two) times daily with a meal.    [provider]  pantoprazole (PROTONIX) 40 MG tablet Take 1 tablet (40 mg total) by mouth daily. 07/18/17   Jearld Fenton, NP  pregabalin (LYRICA) 100 MG capsule Take 1 capsule (100 mg total) by mouth 2 (two) times daily. 03/29/18   Jearld Fenton, NP  ranitidine (ZANTAC) 150 MG tablet Take 150 mg by mouth daily as needed for heartburn.    [provider]  rosuvastatin (CRESTOR) 40 MG tablet Take 1 tablet (40 mg total) by mouth daily. MUST SCHEDULE LAB VISIT 04/22/18   Jearld Fenton, NP  tamsulosin (FLOMAX) 0.4 MG CAPS capsule Take 1 capsule (0.4 mg total) by mouth daily. 03/10/18   Stoioff, Ronda Fairly, MD  tiZANidine (ZANAFLEX) 4 MG tablet Take 1 tablet (4 mg total) by mouth 3 (three) times daily as needed for muscle  spasms. 02/07/18   Jearld Fenton, NP  vitamin B-12 (CYANOCOBALAMIN) 100 MCG tablet Take 100 mcg by mouth daily.    [provider]    Allergies Percocet [oxycodone-acetaminophen]  Family History  Problem Relation Age of Onset  . Breast cancer Sister   . Prostate cancer Brother   . Hyperlipidemia Brother     Social History Social History   Tobacco Use  . Smoking status: Never Smoker  . Smokeless tobacco: Never Used  Substance Use Topics  . Alcohol use: Yes    Comment: occasional beer  . Drug use: No     Review of Systems   Constitutional: No fever/chills ENT: No upper respiratory complaints. Cardiovascular: No chest pain. Respiratory: No SOB. Gastrointestinal: No abdominal pain.  No nausea, no vomiting.  Genitourinary: Negative for dysuria. Musculoskeletal: Positive for back pain.  Skin: Negative for rash, abrasions, lacerations, ecchymosis. Neurological: Negative for numbness or tingling   ____________________________________________   PHYSICAL EXAM:  VITAL SIGNS: ED Triage Vitals  Enc Vitals Group     BP 05/21/18 0728 132/82     Pulse Rate 05/21/18 0728 82     Resp 05/21/18 0728 14     Temp 05/21/18 0728 98.3 F (36.8 C)     Temp Source 05/21/18 0728 Oral     SpO2 05/21/18 0728 93 %     Weight 05/21/18 0728 270 lb (122.5 kg)     Height 05/21/18 0728 6\' 2"  (1.88 m)     Head Circumference --      Peak Flow --      Pain Score 05/21/18 0729 6     Pain Loc --      Pain Edu? --      Excl. in [place]? --      Constitutional: Alert and oriented. Well appearing and in no acute distress. Eyes: Conjunctivae are normal. PERRL. EOMI. Head: Atraumatic. ENT:      Ears:      Nose: No congestion/rhinnorhea.      Mouth/Throat: Mucous membranes are moist.  Neck: No stridor. No cervical spine tenderness to palpation. Cardiovascular: Normal rate, regular rhythm.  Good peripheral circulation. Respiratory: Normal respiratory effort without tachypnea or retractions. Lungs CTAB. Good air entry to the bases with no decreased or absent breath sounds. Gastrointestinal: Bowel sounds 4 quadrants. Soft and nontender to palpation. No guarding or rigidity. No palpable masses. No distention.  Musculoskeletal: Full range of motion to all extremities. No gross deformities appreciated. No pinpoint tenderness over thoracic or lumbar spine.  Normal gait.  Strength equal in upper and lower extremities bilaterally.  Patient is moving around bed and room without difficulty.  Negative straight leg raise. Neurologic:  Normal  speech and language. No gross focal neurologic deficits are appreciated.  Skin:  Skin is warm, dry and intact. No rash noted. Psychiatric: Mood and affect are normal. Speech and behavior are normal. Patient exhibits appropriate insight and judgement.   ____________________________________________   LABS (all labs ordered are listed, but only abnormal results are displayed)  Labs Reviewed  CBC - Abnormal; Notable for the following components:      Result Value   RDW 16.5 (*)    All other components within normal limits  COMPREHENSIVE METABOLIC PANEL - Abnormal; Notable for the following components:   Glucose, Bld 113 (*)    Calcium 8.8 (*)    All other components within normal limits  URINALYSIS, COMPLETE (UACMP) WITH MICROSCOPIC - Abnormal; Notable for the following components:   Color,  Urine STRAW (*)    APPearance CLEAR (*)    All other components within normal limits  TROPONIN I   ____________________________________________  EKG  NSR  ____________________________________________  RADIOLOGY  No results found.  ____________________________________________    PROCEDURES  Procedure(s) performed:    Procedures    Medications  tiZANidine (ZANAFLEX) tablet 4 mg (4 mg Oral Given 05/21/18 1127)     ____________________________________________   INITIAL IMPRESSION / ASSESSMENT AND PLAN / ED COURSE  Pertinent labs & imaging results that were available during my care of the patient were reviewed by me and considered in my medical decision making (see chart for details).  Review of the [place] CSRS was performed in accordance of the [place] prior to dispensing any controlled drugs.   Patient presents emergency department for evaluation of back pain for 3 days.  Vital signs and exam are reassuring.  Lab work is reassuring.  Troponin is negative.  EKG shows normal sinus rhythm.  No infection on urinalysis.  Patient states that pain improved significantly with tizanidine.   He is up walking around the room and hallways without any difficulty.  He appears well.  He still has his tizanidine prescription and will take this as needed.  Patient is to follow up with PCP as directed. Patient is given ED precautions to return to the ED for any worsening or new symptoms.     ____________________________________________  FINAL CLINICAL IMPRESSION(S) / ED DIAGNOSES  Final diagnoses:  Muscle spasm      NEW MEDICATIONS STARTED DURING THIS VISIT:  ED Discharge Orders    None          This chart was dictated using voice recognition software/Dragon. Despite best efforts to proofread, errors can occur which can change the meaning. Any change was purely unintentional.    Laban Emperor, PA-C 05/21/18 1513    Laban Emperor, PA-C 05/21/18 1514    Clearnce Hasten Randall An, MD 05/21/18 [phone]

## 2018-05-21 NOTE — ED Triage Notes (Signed)
Pt presents to ED via [facility] from home c/o burning pain that starts in neck and travels down back into bil legs. Started 05/19/18. Denies injury.

## 2018-05-22 ENCOUNTER — Telehealth: Payer: Self-pay

## 2018-05-22 ENCOUNTER — Telehealth: Payer: Self-pay | Admitting: Internal Medicine

## 2018-05-22 NOTE — Telephone Encounter (Signed)
PLEASE NOTE: All timestamps contained within this report are represented as [place] Standard Time. CONFIDENTIALTY NOTICE: This fax transmission is intended only for the addressee. It contains information that is legally privileged, confidential or otherwise protected from use or disclosure. If you are not the intended recipient, you are strictly prohibited from reviewing, disclosing, copying using or disseminating any of this information or taking any action in reliance on or regarding this information. If you have received this fax in error, please notify us immediately by telephone so that we can arrange for its return to [place]. Phone: [phone], Toll-Free: [phone], Fax: [phone] Page: 1 of 2 Call Id: [id number] [place] Patient Name: Timothy Alvarado Gender: Male DOB: 08-09-1946 Age: 72 Y 20 D Return Phone Number: [phone] (Primary) Address: City/State/Zip: [place] [address] Client  [facility] [place] Night - Client Client Site [place] Physician Webb Silversmith - NP Contact Type Call Who Is Calling Patient / Member / Family / Caregiver Call Type Triage / Clinical Relationship To Patient Self Return Phone Number [phone]) [phone] (Primary) Chief Complaint Back Pain - General Reason for Call Request to Schedule Office Appointment Initial Comment Caller states he would like an appt. He has back pain and burning. Translation No Nurse Assessment Nurse: Pearlie Oyster, RN, Estill Bamberg Date/Time (Eastern Time): 05/21/2018 5:03:35 AM Confirm and document reason for call. If symptomatic, describe symptoms. ---Caller states that he has back pain and burning. Pain in the center of his back for the last couple of days, it feels warm. Pain level is 6-7. Does the patient have any new or worsening symptoms? ---Yes Will a triage be completed? ---Yes Related visit  to physician within the last 2 weeks? ---No Does the PT have any chronic conditions? (i.e. diabetes, asthma, etc.) ---No Is this a behavioral health or substance abuse call? ---No Guidelines Guideline Title Affirmed Question Affirmed Notes Nurse Date/Time (Eastern Time) Back Pain [1] Pain radiates into the thigh or further down the leg AND [2] both legs Pearlie Oyster, RN, Estill Bamberg 05/21/2018 5:07:09 AM Disp. Time Eilene Ghazi Time) Disposition Final User 05/21/2018 5:11:33 AM See HCP within 4 Hours (or PCP triage) Yes Pearlie Oyster, RN, Shelly Coss Disagree/Comply Comply Caller Understands Yes PreDisposition Call Doctor PLEASE NOTE: All timestamps contained within this report are represented as [place] Standard Time. CONFIDENTIALTY NOTICE: This fax transmission is intended only for the addressee. It contains information that is legally privileged, confidential or otherwise protected from use or disclosure. If you are not the intended recipient, you are strictly prohibited from reviewing, disclosing, copying using or disseminating any of this information or taking any action in reliance on or regarding this information. If you have received this fax in error, please notify us immediately by telephone so that we can arrange for its return to [place]. Phone: [phone], Toll-Free: [phone], Fax: [phone] Page: 2 of 2 Call Id: [id number] Care Advice Given Per Guideline SEE HCP WITHIN 4 HOURS (OR PCP TRIAGE): * IF OFFICE WILL BE CLOSED AND NO PCP (PRIMARY CARE PROVIDER) SECOND-LEVEL TRIAGE: You need to be seen within the next 3 or 4 hours. A nearby Urgent Care Center [facility]) is often a good source of care. Another choice is to go to the ED. Go sooner if you become worse. CALL BACK IF: * You become worse. Comments User: Derrill Kay, RN Date/Time Eilene Ghazi Time): 05/21/2018 5:06:41 AM Caller states that he currently has a sinus infection and is  on Flonase. User: Derrill Kay, RN Date/Time Eilene Ghazi  Time): 05/21/2018 5:12:56 AM Caller states that he has pain that radiates into both legs. Pain radiates more into the right leg than the left leg. He does not have an email for Telehealth options. Referrals [facility] - ED

## 2018-05-22 NOTE — Telephone Encounter (Signed)
ED note reviewed 

## 2018-05-22 NOTE — Telephone Encounter (Signed)
Spoke to pt and advised ok to be seen after Tuesday. Per RBaity, pt scheduled for 7/24 @ 1600

## 2018-05-22 NOTE — Telephone Encounter (Signed)
Per chart review tab pt was seen [facility] ED on 05/21/18.

## 2018-05-22 NOTE — Telephone Encounter (Signed)
He needs 30 minute appt, does not need to be Monday or Tuesday. Just schedule sometime this week.

## 2018-05-22 NOTE — Telephone Encounter (Signed)
See below crm can pt be worked in if so where   Copied from [employer] [phone]. Topic: Appointment Scheduling - Scheduling Inquiry for Clinic >> May 22, 2018  9:15 AM Antonieta Iba C wrote: Reason for CRM: pt was seen over the weekend at the hospital for back pain. Pt was advised to follow up with PCP today or tomorrow (Monday or Tuesday) not showing an opening with PCP, please assist pt with scheduling.    CB: [phone]

## 2018-05-23 NOTE — Telephone Encounter (Signed)
Last Rx 01/2018 #20. Last OV 05/2018-acute

## 2018-05-24 ENCOUNTER — Ambulatory Visit (INDEPENDENT_AMBULATORY_CARE_PROVIDER_SITE_OTHER): Payer: Medicare HMO | Admitting: Internal Medicine

## 2018-05-24 ENCOUNTER — Encounter: Payer: Self-pay | Admitting: Internal Medicine

## 2018-05-24 VITALS — BP 128/80 | HR 82 | Temp 98.6°F | Wt 269.0 lb

## 2018-05-24 DIAGNOSIS — K219 Gastro-esophageal reflux disease without esophagitis: Secondary | ICD-10-CM | POA: Diagnosis not present

## 2018-05-24 DIAGNOSIS — E7800 Pure hypercholesterolemia, unspecified: Secondary | ICD-10-CM

## 2018-05-24 DIAGNOSIS — R7303 Prediabetes: Secondary | ICD-10-CM | POA: Diagnosis not present

## 2018-05-24 DIAGNOSIS — M62830 Muscle spasm of back: Secondary | ICD-10-CM

## 2018-05-24 MED ORDER — PANTOPRAZOLE SODIUM 40 MG PO TBEC: 40.0000 mg | DELAYED_RELEASE_TABLET | Freq: Every day | ORAL | 2 refills | Status: DC

## 2018-05-24 MED ORDER — TIZANIDINE HCL 4 MG PO TABS: 4.0000 mg | ORAL_TABLET | Freq: Three times a day (TID) | ORAL | 0 refills | Status: AC | PRN

## 2018-05-24 MED ORDER — PREGABALIN 100 MG PO CAPS: 100.0000 mg | ORAL_CAPSULE | Freq: Two times a day (BID) | ORAL | 2 refills | Status: AC

## 2018-05-24 NOTE — Progress Notes (Signed)
Subjective:    Patient ID: Timothy Alvarado, male    DOB: 1945-11-29, 72 y.o.   MRN: [id number]  HPI  Pt presents to the clinic today for ER follow up. He went to the ER 7/21 with c/o back pain. He describes the pain as burning and tight. ECG was normal. Labs were unremarkable. He has report episodes of similar back pain in the past, which improves when he takes Zanaflex. He was given a RX for Zanaflex and advised to follow up with his PCP. Since discharge, he reports intermittent back pain. He reports the Zanaflex has helped tremendously. He is not using ice, heat or doing any stretching at this time.  He also reports that his throat feels sore and tight. He feels like he is having some trouble swallowing, but denies food getting caught in the esophagus. He is having some heartburn, but is taking Ranitidine OTC. He is prescribed Pantoprazole but reports he is not taking this. He denies runny nose, nasal congestion, ear pain or cough. He denies nausea or vomiting. He has not tried anything OTC for his symptoms.  He is also due for repeat lipid profile and A1C today.  Review of Systems      Past Medical History:  Diagnosis Date  . Allergy   . GERD (gastroesophageal reflux disease)   . Glaucoma   . Hyperlipidemia     Current Outpatient Medications  Medication Sig Dispense Refill  . acetaminophen (TYLENOL) 500 MG tablet Take 1 tablet (500 mg total) by mouth every 6 (six) hours as needed. 30 tablet 0  . aspirin 81 MG tablet Take 81 mg by mouth daily as needed for pain.     Marland Kitchen aspirin-acetaminophen-caffeine (EXCEDRIN MIGRAINE) 250-250-65 MG tablet Take 2 tablets by mouth every 6 (six) hours as needed for headache.    . bimatoprost (LUMIGAN) 0.01 % SOLN Place 1 drop into both eyes at bedtime.    . fluticasone (FLONASE) 50 MCG/ACT nasal spray Place 2 sprays into both nostrils daily. 16 g 3  . latanoprost (XALATAN) 0.005 % ophthalmic solution INSTILL 1 DROP INTO AFFECTED EYE EVERY DAY AS DIRECTED   6  . naproxen (NAPROSYN) 500 MG tablet Take 500 mg by mouth 2 (two) times daily with a meal.    . pantoprazole (PROTONIX) 40 MG tablet Take 1 tablet (40 mg total) by mouth daily. 30 tablet 2  . pregabalin (LYRICA) 100 MG capsule Take 1 capsule (100 mg total) by mouth 2 (two) times daily. 60 capsule 2  . ranitidine (ZANTAC) 150 MG tablet Take 150 mg by mouth daily as needed for heartburn.    . rosuvastatin (CRESTOR) 40 MG tablet Take 1 tablet (40 mg total) by mouth daily. MUST SCHEDULE LAB VISIT 30 tablet 0  . tamsulosin (FLOMAX) 0.4 MG CAPS capsule Take 1 capsule (0.4 mg total) by mouth daily. 30 capsule 11  . tiZANidine (ZANAFLEX) 4 MG tablet Take 1 tablet (4 mg total) by mouth 3 (three) times daily as needed for muscle spasms. 20 tablet 0  . vitamin B-12 (CYANOCOBALAMIN) 100 MCG tablet Take 100 mcg by mouth daily.     No current facility-administered medications for this visit.     Allergies  Allergen Reactions  . Percocet [Oxycodone-Acetaminophen] Other (See Comments)    hallucinations    Family History  Problem Relation Age of Onset  . Breast cancer Sister   . Prostate cancer Brother   . Hyperlipidemia Brother     Social History   Socioeconomic  History  . Marital status: Married    Spouse name: Not on file  . Number of children: Not on file  . Years of education: Not on file  . Highest education level: Not on file  Occupational History  . Not on file  Social Needs  . Financial resource strain: Not on file  . Food insecurity:    Worry: Not on file    Inability: Not on file  . Transportation needs:    Medical: Not on file    Non-medical: Not on file  Tobacco Use  . Smoking status: Never Smoker  . Smokeless tobacco: Never Used  Substance and Sexual Activity  . Alcohol use: Yes    Comment: occasional beer  . Drug use: No  . Sexual activity: Never  Lifestyle  . Physical activity:    Days per week: Not on file    Minutes per session: Not on file  . Stress: Not on  file  Relationships  . Social connections:    Talks on phone: Not on file    Gets together: Not on file    Attends religious service: Not on file    Active member of club or organization: Not on file    Attends meetings of clubs or organizations: Not on file    Relationship status: Not on file  . Intimate partner violence:    Fear of current or ex partner: Not on file    Emotionally abused: Not on file    Physically abused: Not on file    Forced sexual activity: Not on file  Other Topics Concern  . Not on file  Social History Narrative  . Not on file     Constitutional: Denies fever, malaise, fatigue, headache or abrupt weight changes.  HEENT: Denies eye pain, eye redness, ear pain, ringing in the ears, wax buildup, runny nose, nasal congestion, bloody nose, or sore throat. Respiratory: Denies difficulty breathing, shortness of breath, cough or sputum production.   Cardiovascular: Denies chest pain, chest tightness, palpitations or swelling in the hands or feet.  Gastrointestinal: Pt reports difficulty swallowing, reflux. Denies abdominal pain, bloating, constipation, diarrhea or blood in the stool.  GU: Denies urgency, frequency, pain with urination, burning sensation, blood in urine, odor or discharge. Musculoskeletal: Pt reports back pain. Denies decrease in range of motion, difficulty with gait, or joint swelling.  Skin: Denies redness, rashes, lesions or ulcercations.  Neurological: Denies dizziness, difficulty with memory, difficulty with speech or problems with balance and coordination.  Psych: Denies anxiety, depression, SI/HI.  No other specific complaints in a complete review of systems (except as listed in HPI above).  Objective:   Physical Exam   BP 128/80   Pulse 82   Temp 98.6 F (37 C) (Oral)   Wt 269 lb (122 kg)   SpO2 98%   BMI 34.54 kg/m  Wt Readings from Last 3 Encounters:  05/24/18 269 lb (122 kg)  05/21/18 270 lb (122.5 kg)  05/01/18 271 lb 4 oz  (123 kg)    General: Appears his stated age, obese in NAD. HEENT: Throat/Mouth: Teeth present, mucosa pink and moist, no exudate, lesions or ulcerations noted.  Neck:  Neck supple, trachea midline. No masses, lumps or thyromegaly present.  Cardiovascular: Normal rate and rhythm. S1,S2 noted.  No murmur, rubs or gallops noted.  Pulmonary/Chest: Normal effort and positive vesicular breath sounds. No respiratory distress. No wheezes, rales or ronchi noted.  Abdomen: Soft and nontender. Normal bowel sounds. No distention or  masses noted.  Musculoskeletal: Normal flexion, extension and rotation of the spine. Bony tenderness noted over the length of the entire spine. No pain with palpation of the paraspinal muscles. Strength 5/5 BUE/BLE. Neurological: Alert and oriented.   BMET    Component Value Date/Time   NA 138 05/21/2018 0923   K 4.1 05/21/2018 0923   CL 107 05/21/2018 0923   CO2 25 05/21/2018 0923   GLUCOSE 113 (H) 05/21/2018 0923   BUN 15 05/21/2018 0923   CREATININE 1.07 05/21/2018 0923   CALCIUM 8.8 (L) 05/21/2018 0923   GFRNONAA >60 05/21/2018 0923   GFRAA >60 05/21/2018 0923    Lipid Panel     Component Value Date/Time   CHOL 318 (H) 12/26/2017 1005   TRIG 74.0 12/26/2017 1005   HDL 46.90 12/26/2017 1005   CHOLHDL 7 12/26/2017 1005   VLDL 14.8 12/26/2017 1005   LDLCALC 256 (H) 12/26/2017 1005    CBC    Component Value Date/Time   WBC 6.6 05/21/2018 0923   RBC 4.51 05/21/2018 0923   HGB 13.8 05/21/2018 0923   HCT 41.2 05/21/2018 0923   PLT 229 05/21/2018 0923   MCV 91.4 05/21/2018 0923   MCH 30.6 05/21/2018 0923   MCHC 33.5 05/21/2018 0923   RDW 16.5 (H) 05/21/2018 0923    Hgb A1C Lab Results  Component Value Date   HGBA1C 6.3 12/26/2017           Assessment & Plan:   ER Follow Up for Muscle Spasms of Back:  ER notes, lab and imaging reviewed Encouraged core strengthening and exercise for weight loss Zanaflex refilled today Can take Naproxen  as needed  GERD:  Restart Pantoprazole, refilled today Continue Ranitidine Let me know if symptoms persist or worsen  HLD:  Repeat Lipid profile today  Prediabetes:  Repeat A1C today  Will follow up after labs, return precautions discussed Webb Silversmith, NP   Return precautions discussed Webb Silversmith, NP

## 2018-05-24 NOTE — Patient Instructions (Signed)

## 2018-05-25 LAB — LIPID PANEL
Cholesterol: 198 mg/dL (ref 0–200)
HDL: 49.7 mg/dL (ref >39.00)
LDL Cholesterol: 131 mg/dL — ABNORMAL HIGH (ref 0–99)
NonHDL: 148.23
Total CHOL/HDL Ratio: 4
Triglycerides: 86 mg/dL (ref 0.0–149.0)
VLDL: 17.2 mg/dL (ref 0.0–40.0)

## 2018-05-25 LAB — HEMOGLOBIN A1C: HEMOGLOBIN A1C: 6.4 % (ref 4.6–6.5)

## 2018-05-29 ENCOUNTER — Emergency Department: Payer: Medicare HMO

## 2018-05-29 ENCOUNTER — Emergency Department
Admission: EM | Admit: 2018-05-29 | Discharge: 2018-05-29 | Disposition: A | Discharge status: Home / Self Care | Payer: Medicare HMO | Attending: Emergency Medicine | Admitting: Emergency Medicine

## 2018-05-29 ENCOUNTER — Other Ambulatory Visit: Payer: Self-pay

## 2018-05-29 ENCOUNTER — Encounter: Payer: Self-pay | Admitting: Emergency Medicine

## 2018-05-29 ENCOUNTER — Telehealth: Payer: Self-pay

## 2018-05-29 DIAGNOSIS — R51 Headache: Secondary | ICD-10-CM | POA: Diagnosis not present

## 2018-05-29 DIAGNOSIS — Z79899 Other long term (current) drug therapy: Secondary | ICD-10-CM | POA: Diagnosis not present

## 2018-05-29 DIAGNOSIS — R202 Paresthesia of skin: Secondary | ICD-10-CM | POA: Insufficient documentation

## 2018-05-29 DIAGNOSIS — R270 Ataxia, unspecified: Secondary | ICD-10-CM | POA: Diagnosis not present

## 2018-05-29 DIAGNOSIS — R109 Unspecified abdominal pain: Secondary | ICD-10-CM

## 2018-05-29 DIAGNOSIS — R05 Cough: Secondary | ICD-10-CM | POA: Diagnosis not present

## 2018-05-29 DIAGNOSIS — S0990XA Unspecified injury of head, initial encounter: Secondary | ICD-10-CM | POA: Diagnosis not present

## 2018-05-29 DIAGNOSIS — R55 Syncope and collapse: Secondary | ICD-10-CM | POA: Diagnosis not present

## 2018-05-29 DIAGNOSIS — R058 Other specified cough: Secondary | ICD-10-CM

## 2018-05-29 DIAGNOSIS — R1013 Epigastric pain: Secondary | ICD-10-CM | POA: Diagnosis not present

## 2018-05-29 LAB — BASIC METABOLIC PANEL
Anion gap: 9 (ref 5–15)
BUN: 16 mg/dL (ref 8–23)
CHLORIDE: 107 mmol/L (ref 98–111)
CO2: 25 mmol/L (ref 22–32)
CREATININE: 1.23 mg/dL (ref 0.61–1.24)
Calcium: 8.9 mg/dL (ref 8.9–10.3)
GFR calc non Af Amer: 58 mL/min — ABNORMAL LOW (ref >60)
Glucose, Bld: 108 mg/dL — ABNORMAL HIGH (ref 70–99)
POTASSIUM: 4 mmol/L (ref 3.5–5.1)
Sodium: 141 mmol/L (ref 135–145)

## 2018-05-29 LAB — URINALYSIS, COMPLETE (UACMP) WITH MICROSCOPIC
BILIRUBIN URINE: NEGATIVE
Glucose, UA: NEGATIVE mg/dL
KETONES UR: 5 mg/dL — AB
Leukocytes, UA: NEGATIVE
Nitrite: NEGATIVE
PROTEIN: NEGATIVE mg/dL
SPECIFIC GRAVITY, URINE: 1.032 — AB (ref 1.005–1.030)
pH: 5 (ref 5.0–8.0)

## 2018-05-29 LAB — CBC
HEMATOCRIT: 41.5 % (ref 40.0–52.0)
Hemoglobin: 13.9 g/dL (ref 13.0–18.0)
MCH: 30.3 pg (ref 26.0–34.0)
MCHC: 33.6 g/dL (ref 32.0–36.0)
MCV: 90.3 fL (ref 80.0–100.0)
PLATELETS: 253 10*3/uL (ref 150–440)
RBC: 4.6 MIL/uL (ref 4.40–5.90)
RDW: 16.3 % — ABNORMAL HIGH (ref 11.5–14.5)
WBC: 5.9 10*3/uL (ref 3.8–10.6)

## 2018-05-29 LAB — TROPONIN I

## 2018-05-29 MED ORDER — GADOBENATE DIMEGLUMINE 529 MG/ML IV SOLN
20.0000 mL | Freq: Once | INTRAVENOUS | Status: AC | PRN
  Administered 2018-05-29: 20 mL via INTRAVENOUS

## 2018-05-29 MED ORDER — ASPIRIN 81 MG PO CHEW
324.0000 mg | CHEWABLE_TABLET | Freq: Once | ORAL | Status: AC
  Administered 2018-05-29: 324 mg via ORAL
  Filled 2018-05-29: qty 4

## 2018-05-29 NOTE — Telephone Encounter (Signed)
Patient came by the office today and states that he was having trouble with choking on food on Saturday 05/27/18 to the point that he fell and passed out. He is not sure for how long he was unconscious, he did hit his head. His head is hurting today, he is having some tenderness in the chest and rb area, feels nauseas in his stomach and still having trouble with choking off and on. Spoke with [employer] Sent patient to ER for evaluation. Patient understood and went to ER from our [employer], RMA

## 2018-05-29 NOTE — ED Triage Notes (Signed)
First Nurse Note:  Patient states on Saturday night he was coughing and while walking to the bathroom he fell down and hit his head.  Unsure if he lost consciousness or not.  Patient is AAOx3.  Skin warm and dry.  Ambulates with easy and steady gait.  NAD

## 2018-05-29 NOTE — Telephone Encounter (Signed)
Will follow up after hospital visit

## 2018-05-29 NOTE — Discharge Instructions (Signed)
Please start a baby aspirin (81 mg) daily.  You have been seen today in the Emergency Department (ED)  for syncope (passing out).  Your workup including labs and EKG show reassuring results.  Your symptoms may be due to dehydration, so it is important that you drink plenty of non-alcoholic fluids.  Please call your regular doctor as soon as possible to schedule the next available clinic appointment to follow up with him/her regarding your visit to the ED and your symptoms.  Return to the Emergency Department (ED)  if you have any further syncopal episodes (pass out again) or develop ANY chest pain, pressure, tightness, trouble breathing, sudden sweating, or other symptoms that concern you.

## 2018-05-29 NOTE — ED Triage Notes (Signed)
Patient presents to the ED with abdominal pain x 1 week.  Patient states, "I've had a lot of reflux and this pas week, I've had burning in my throat and I've been coughing up a lot of phlegm.  My chest is sore from coughing."  Patient states that he has been somewhat weak and dizzy as well.  Patient states today he was in the bathroom trying to cough up phlegm and he passed out and fell and hit his head.  Patient denies taking blood thinners.  No obvious distress at this time.

## 2018-05-29 NOTE — ED Provider Notes (Signed)
[facility] Emergency Department Provider Note  ____________________________________________   First MD Initiated Contact with Patient 05/29/18 1153     (approximate)  I have reviewed the triage vital signs and the nursing notes.   HISTORY  Chief Complaint Abdominal Pain    HPI Timothy Alvarado is a 72 y.o. male reports minimal medical history except for hyper lipidemia  Patient reports that for about a week now these been noticing that he felt when he was walking that he had a just a slight feeling of a little difference on the left side of his body just feeling a little bit more weak.  He reports he also got dizzy about 2 days ago, after coughing and he passed out briefly Saturday night.  No nausea or vomiting.  Reports that for at least a month now is been feeling like he gets "reflux" from his upper stomach feels like a slight burning sensation in his throat at times, caused him to cough frequently seemingly a little bit more in the mornings and sometimes in the evening after eating.  Reports is not having abdominal pain now, but reports is a bit of a discomfort in his stomach area that comes and goes and seems to correlate with the cough and feeling of acid in his mouth  Reports when he passed out on Saturday he had a cough, got lightheaded.  He is not certain if he hit his head hard or not.  He reports he never passed out before but when he coughs in the past he sometimes has got lightheaded where he thought he might pass out.  He is not 478% certain that he actually passed out, but reports that she certainly can really close if he did not.   Past Medical History:  Diagnosis Date  . Allergy   . GERD (gastroesophageal reflux disease)   . Glaucoma   . Hyperlipidemia     Patient Active Problem List   Diagnosis Date Noted  . Elevated PSA 01/26/2018  . Benign prostatic hyperplasia with lower urinary tract symptoms 01/26/2018  . Seasonal allergies  05/10/2017  . GERD (gastroesophageal reflux disease) 05/10/2017  . HLD (hyperlipidemia) 05/10/2017  . Glaucoma 05/10/2017  . Borderline diabetes 05/10/2017  . Neuropathy 05/10/2017    Past Surgical History:  Procedure Laterality Date  . BACK SURGERY     lumbar   . ROTATOR CUFF REPAIR Left     Prior to Admission medications   Medication Sig Start Date End Date Taking? Authorizing Provider  acetaminophen (TYLENOL) 500 MG tablet Take 1 tablet (500 mg total) by mouth every 6 (six) hours as needed. 10/24/17   Shary Decamp, PA-C  aspirin 81 MG tablet Take 81 mg by mouth daily as needed for pain.     [provider]  aspirin-acetaminophen-caffeine (EXCEDRIN MIGRAINE) [phone] MG tablet Take 2 tablets by mouth every 6 (six) hours as needed for headache.    [provider]  bimatoprost (LUMIGAN) 0.01 % SOLN Place 1 drop into both eyes at bedtime.    [provider]  fluticasone (FLONASE) 50 MCG/ACT nasal spray Place 2 sprays into both nostrils daily. 01/20/18   Ria Bush, MD  latanoprost (XALATAN) 0.005 % ophthalmic solution INSTILL 1 DROP INTO AFFECTED EYE EVERY DAY AS DIRECTED 03/28/18   [provider]  naproxen (NAPROSYN) 500 MG tablet Take 500 mg by mouth 2 (two) times daily with a meal.    [provider]  pantoprazole (PROTONIX) 40 MG tablet Take  1 tablet (40 mg total) by mouth daily. 05/24/18   Jearld Fenton, NP  pregabalin (LYRICA) 100 MG capsule Take 1 capsule (100 mg total) by mouth 2 (two) times daily. 05/24/18   Jearld Fenton, NP  ranitidine (ZANTAC) 150 MG tablet Take 150 mg by mouth daily as needed for heartburn.    [provider]  rosuvastatin (CRESTOR) 40 MG tablet Take 1 tablet (40 mg total) by mouth daily. MUST SCHEDULE LAB VISIT 04/22/18   Jearld Fenton, NP  tamsulosin (FLOMAX) 0.4 MG CAPS capsule Take 1 capsule (0.4 mg total) by mouth daily. 03/10/18   Stoioff, Ronda Fairly, MD  tiZANidine (ZANAFLEX) 4 MG tablet Take  1 tablet (4 mg total) by mouth 3 (three) times daily as needed for muscle spasms. 05/24/18   Jearld Fenton, NP  vitamin B-12 (CYANOCOBALAMIN) 100 MCG tablet Take 100 mcg by mouth daily.    [provider]    Allergies Percocet [oxycodone-acetaminophen]  Family History  Problem Relation Age of Onset  . Breast cancer Sister   . Prostate cancer Brother   . Hyperlipidemia Brother     Social History Social History   Tobacco Use  . Smoking status: Never Smoker  . Smokeless tobacco: Never Used  Substance Use Topics  . Alcohol use: Yes    Comment: occasional beer  . Drug use: No    Review of Systems Constitutional: No fever/chills Eyes: No visual changes. ENT: No sore throat. Cardiovascular: Denies chest pain.  Does have a frequent cough Respiratory: Denies shortness of breath. Gastrointestinal: See HPI no diarrhea.  No constipation. Genitourinary: Negative for dysuria. Musculoskeletal: Negative for back pain. Skin: Negative for rash. Neurological: Negative for headaches or numbness.  Feels like it is just a little bit "weaker" in the left arm and left leg and on the right side of the body for about 1 week's time now.  Reports he is able to walk without difficulty.  Has not noticed any problems coordination.  No changes in his speech.  Able to smile normally and talk normally.    ____________________________________________   PHYSICAL EXAM:  VITAL SIGNS: ED Triage Vitals [05/29/18 1124]  Enc Vitals Group     BP (!) 144/85     Pulse Rate 70     Resp 18     Temp 98.1 F (36.7 C)     Temp Source Oral     SpO2 97 %     Weight 269 lb (122 kg)     Height 6\' 3"  (1.905 m)     Head Circumference      Peak Flow      Pain Score 7     Pain Loc      Pain Edu?      Excl. in [place]?     Constitutional: Alert and oriented. Well appearing and in no acute distress. Eyes: Conjunctivae are normal. Head: Atraumatic. Nose: No congestion/rhinnorhea. Mouth/Throat: Mucous  membranes are moist. Neck: No stridor.   Cardiovascular: Normal rate, regular rhythm. Grossly normal heart sounds.  Good peripheral circulation. Respiratory: Normal respiratory effort.  No retractions. Lungs CTAB. Gastrointestinal: Soft and nontender. No distention. Musculoskeletal: No lower extremity tenderness nor edema. Neurologic:  Normal speech and language. No gross focal neurologic deficits are appreciated.  Moves all extremities with 5 out of 5 strength.  There is no pronator drift.  Extraocular movements are normal.  No notable ataxia.  He may be just slightly diminished with regard to strength in the  left arm versus the right arm but certainly there is still 5 out of 5, somewhat hard to tell if there is any discernible difference. Skin:  Skin is warm, dry and intact. No rash noted. Psychiatric: Mood and affect are normal. Speech and behavior are normal.  ____________________________________________   LABS (all labs ordered are listed, but only abnormal results are displayed)  Labs Reviewed  BASIC METABOLIC PANEL - Abnormal; Notable for the following components:      Result Value   Glucose, Bld 108 (*)    GFR calc non Af Amer 58 (*)    All other components within normal limits  CBC - Abnormal; Notable for the following components:   RDW 16.3 (*)    All other components within normal limits  URINALYSIS, COMPLETE (UACMP) WITH MICROSCOPIC - Abnormal; Notable for the following components:   Color, Urine YELLOW (*)    APPearance TURBID (*)    Specific Gravity, Urine 1.032 (*)    Hgb urine dipstick SMALL (*)    Ketones, ur 5 (*)    Bacteria, UA RARE (*)    Crystals PRESENT (*)    All other components within normal limits  URINE CULTURE  TROPONIN I   ____________________________________________  EKG  ED ECG REPORT I, Delman Kitten, the attending physician, personally viewed and interpreted this ECG.  Date: 05/29/2018 EKG Time: 1130 Rate: 70 Rhythm: normal sinus  rhythm QRS Axis: normal Intervals: normal ST/T Wave abnormalities: normal Narrative Interpretation: no evidence of acute ischemia  ____________________________________________  RADIOLOGY  Dg Chest 2 View  Result Date: 05/29/2018 CLINICAL DATA:  Cough. EXAM: CHEST - 2 VIEW COMPARISON:  Radiographs of October 24, 2017. FINDINGS: The heart size and mediastinal contours are within normal limits. Both lungs are clear. No pneumothorax or pleural effusion is noted. The visualized skeletal structures are unremarkable. IMPRESSION: No active cardiopulmonary disease. Electronically Signed   By: Marijo Conception, M.D.   On: 05/29/2018 13:19   Ct Head Wo Contrast  Result Date: 05/29/2018 CLINICAL DATA:  Fall, head trauma 2 days ago.  Ataxia EXAM: CT HEAD WITHOUT CONTRAST TECHNIQUE: Contiguous axial images were obtained from the base of the skull through the vertex without intravenous contrast. COMPARISON:  None. FINDINGS: Brain: Cerebral volume and ventricles normal. Extensive white matter hypodensity diffusely and bilaterally. Focal hypodensity in the right frontal parietal lobe extending to cortex could represent acute infarct. Similar hypodensity in the left frontal parietal cortex. Negative for hemorrhage. Vascular: Normal arterial flow voids Skull: Negative Sinuses/Orbits: Negative Other: None IMPRESSION: Diffuse white matter abnormality likely due to chronic microvascular ischemia. Focal areas of hypodensity in the frontal parietal cortex bilaterally could represent acute infarct or possibly edema from tumor. MRI brain without and with contrast recommended for further evaluation Electronically Signed   By: Franchot Gallo M.D.   On: 05/29/2018 11:53   Mr Jeri Cos And Wo Contrast  Result Date: 05/29/2018 CLINICAL DATA:  Muscle weakness. Headache. Fall 3 days ago. Abnormal CT EXAM: MRI HEAD WITHOUT AND WITH CONTRAST TECHNIQUE: Multiplanar, multiecho pulse sequences of the brain and surrounding structures  were obtained without and with intravenous contrast. CONTRAST:  20mL MULTIHANCE GADOBENATE DIMEGLUMINE 529 MG/ML IV SOLN COMPARISON:  CT head 05/29/2018 FINDINGS: Brain: Ventricle size normal.  Cerebral volume normal. Negative for acute infarct. Extensive changes throughout the subcortical and deep white matter bilaterally as well as in the temporal lobes bilaterally most consistent with chronic ischemia. These findings account for the abnormality on CT. Negative for hemorrhage or  mass Normal enhancement postcontrast administration. No evidence of mass lesion or tumor Vascular: Normal arterial flow voids Skull and upper cervical spine: Cervical spondylosis. Disc degeneration and spurring at C3-4 causing spinal stenosis. No skeletal mass lesion Sinuses/Orbits: Negative Other: None IMPRESSION: No acute intracranial abnormality.  No acute infarct or tumor Extensive chronic white matter disease likely due to ischemia. Electronically Signed   By: Franchot Gallo M.D.   On: 05/29/2018 14:45     X-rays CT and MRI results reviewed by me. ____________________________________________   PROCEDURES  Procedure(s) performed: None  Procedures  Critical Care performed: No  ____________________________________________   INITIAL IMPRESSION / ASSESSMENT AND PLAN / ED COURSE  Pertinent labs & imaging results that were available during my care of the patient were reviewed by me and considered in my medical decision making (see chart for details).  Patient reports several different symptoms, including a syncopal or near syncopal episode Saturday followed episode where he is coughing.  He is experience for about a month now intermittent discomfort in his upper stomach and a burning acid taste in his mouth that he reports induces and a cough at times.  Very clinically reassuring exam, currently not having any abdominal pain on examination.  He does however, incidentally reports that he is having some slight weakness  in his left side of his body although it is not easily appreciated on my exam.  CT scan is read out as slightly abnormal by radiologist, and I have requested a consultation with Dr. Doy Mince for further evaluation regarding his report of a slight left-sided weakness and abnormal CT finding.    Denies acute cardiac symptoms, normal EKG.  Doubt cardiac etiology.  No palpitations or chest pain preceding his syncopal episode either.  May have acid reflux, but wished to ensure no evidence of cardiac etiology as well as further evaluate for symptoms of slight weakness.    ----------------------------------------- 2:57 PM on 05/29/2018 -----------------------------------------  Patient resting comfortably, has been seen by Dr. Doy Mince who recommends patient be started on baby aspirin daily with outpatient neurology follow-up, no evidence of acute stroke.  She recommends an outpatient work-up.  Patient agreeable with plan, feels well at this time.  Will discharge, recommend close primary care follow-up and also outpatient neurology follow-up.  ____________________________________________   FINAL CLINICAL IMPRESSION(S) / ED DIAGNOSES  Final diagnoses:  Abdominal discomfort  Dyspepsia  Paresthesia of left arm and leg  Post-tussive syncope      NEW MEDICATIONS STARTED DURING THIS VISIT:  New Prescriptions   No medications on file     Note:  This document was prepared using Dragon voice recognition software and may include unintentional dictation errors.     Delman Kitten, MD 05/29/18 1500

## 2018-05-29 NOTE — Consult Note (Signed)
Referring Physician: Quale    Chief Complaint: Left sided weakness  HPI: Timothy Alvarado is an 72 y.o. male who reports that for the past week he has felt that his left side was not as strong as it is at baseline.  He has remained ambulatory and able to perform all activities of daily living.  Came to the ED cough and fall related to syncope. Initial NIHSS of 0.  Date last known well: Unable to determine Time last known well: Unable to determine tPA Given: No: Unable to determine LKW  Past Medical History:  Diagnosis Date  . Allergy   . GERD (gastroesophageal reflux disease)   . Glaucoma   . Hyperlipidemia     Past Surgical History:  Procedure Laterality Date  . BACK SURGERY     lumbar   . ROTATOR CUFF REPAIR Left     Family History  Problem Relation Age of Onset  . Breast cancer Sister   . Prostate cancer Brother   . Hyperlipidemia Brother    Social History:  reports that he has never smoked. He has never used smokeless tobacco. He reports that he drinks alcohol. He reports that he does not use drugs.  Allergies:  Allergies  Allergen Reactions  . Percocet [Oxycodone-Acetaminophen] Other (See Comments)    hallucinations    Medications: I have reviewed the patient's current medications. Prior to Admission:  Prior to Admission medications   Medication Sig Start Date End Date Taking? Authorizing Provider  acetaminophen (TYLENOL) 500 MG tablet Take 1 tablet (500 mg total) by mouth every 6 (six) hours as needed. 10/24/17   Shary Decamp, PA-C  aspirin 81 MG tablet Take 81 mg by mouth daily as needed for pain.     [provider]  aspirin-acetaminophen-caffeine (EXCEDRIN MIGRAINE) [phone] MG tablet Take 2 tablets by mouth every 6 (six) hours as needed for headache.    [provider]  bimatoprost (LUMIGAN) 0.01 % SOLN Place 1 drop into both eyes at bedtime.    [provider]  fluticasone (FLONASE) 50 MCG/ACT nasal spray Place 2 sprays into both  nostrils daily. 01/20/18   Ria Bush, MD  latanoprost (XALATAN) 0.005 % ophthalmic solution INSTILL 1 DROP INTO AFFECTED EYE EVERY DAY AS DIRECTED 03/28/18   [provider]  naproxen (NAPROSYN) 500 MG tablet Take 500 mg by mouth 2 (two) times daily with a meal.    [provider]  pantoprazole (PROTONIX) 40 MG tablet Take 1 tablet (40 mg total) by mouth daily. 05/24/18   Jearld Fenton, NP  pregabalin (LYRICA) 100 MG capsule Take 1 capsule (100 mg total) by mouth 2 (two) times daily. 05/24/18   Jearld Fenton, NP  ranitidine (ZANTAC) 150 MG tablet Take 150 mg by mouth daily as needed for heartburn.    [provider]  rosuvastatin (CRESTOR) 40 MG tablet Take 1 tablet (40 mg total) by mouth daily. MUST SCHEDULE LAB VISIT 04/22/18   Jearld Fenton, NP  tamsulosin (FLOMAX) 0.4 MG CAPS capsule Take 1 capsule (0.4 mg total) by mouth daily. 03/10/18   Stoioff, Ronda Fairly, MD  tiZANidine (ZANAFLEX) 4 MG tablet Take 1 tablet (4 mg total) by mouth 3 (three) times daily as needed for muscle spasms. 05/24/18   Jearld Fenton, NP  vitamin B-12 (CYANOCOBALAMIN) 100 MCG tablet Take 100 mcg by mouth daily.    [provider]     ROS: History obtained from the patient  General ROS: negative for - chills,  fatigue, fever, night sweats, weight gain or weight loss Psychological ROS: negative for - behavioral disorder, hallucinations, memory difficulties, mood swings or suicidal ideation Ophthalmic ROS: negative for - blurry vision, double vision, eye pain or loss of vision ENT ROS: dizziness Allergy and Immunology ROS: negative for - hives or itchy/watery eyes Hematological and Lymphatic ROS: negative for - bleeding problems, bruising or swollen lymph nodes Endocrine ROS: negative for - galactorrhea, hair pattern changes, polydipsia/polyuria or temperature intolerance Respiratory ROS: cough Cardiovascular ROS: negative for - chest pain, dyspnea on exertion, edema or  irregular heartbeat Gastrointestinal ROS: negative for - abdominal pain, diarrhea, hematemesis, nausea/vomiting or stool incontinence Genito-Urinary ROS: negative for - dysuria, hematuria, incontinence or urinary frequency/urgency Musculoskeletal ROS: negative for - joint swelling or muscular weakness Neurological ROS: as noted in HPI Dermatological ROS: negative for rash and skin lesion changes  Physical Examination: Blood pressure 136/78, pulse 65, temperature 98.1 F (36.7 C), temperature source Oral, resp. rate 15, height 6\' 3"  (1.905 m), weight 122 kg (269 lb), SpO2 94 %.  HEENT-  Normocephalic, no lesions, without obvious abnormality.  Normal external eye and conjunctiva.  Normal TM's bilaterally.  Normal auditory canals and external ears. Normal external nose, mucus membranes and septum.  Normal pharynx. Cardiovascular- S1, S2 normal, pulses palpable throughout   Lungs- chest clear, no wheezing, rales, normal symmetric air entry Abdomen- soft, non-tender; bowel sounds normal; no masses,  no organomegaly Extremities- no edema Lymph-no adenopathy palpable Musculoskeletal-no joint tenderness, deformity or swelling Skin-warm and dry, no hyperpigmentation, vitiligo, or suspicious lesions  Neurological Examination   Mental Status: Alert, oriented, thought content appropriate.  Speech fluent without evidence of aphasia.  Able to follow 3 step commands without difficulty. Cranial Nerves: II: Discs flat bilaterally; Visual fields grossly normal, pupils equal, round, reactive to light and accommodation III,IV, VI: ptosis not present, extra-ocular motions intact bilaterally V,VII: smile symmetric, facial light touch sensation normal bilaterally VIII: hearing normal bilaterally IX,X: gag reflex present XI: bilateral shoulder shrug XII: midline tongue extension Motor: Right : Upper extremity   5/5    Left:     Upper extremity   5/5  Lower extremity   5/5     Lower extremity   5/5 Tone and  bulk:normal tone throughout; no atrophy noted Sensory: Pinprick and light touch intact throughout, bilaterally Deep Tendon Reflexes: 2+ and symmetric throughout Plantars: Right: downgoing   Left: downgoing Cerebellar: Normal finger-to-nose and normal heel-to-shin testing bilatterally Gait: not tested due to safety concerns    Laboratory Studies:  Basic Metabolic Panel: Recent Labs  Lab 05/29/18 1133  NA 141  K 4.0  CL 107  CO2 25  GLUCOSE 108*  BUN 16  CREATININE 1.23  CALCIUM 8.9    Liver Function Tests: No results for input(s): AST, ALT, ALKPHOS, BILITOT, PROT, ALBUMIN in the last 168 hours. No results for input(s): LIPASE, AMYLASE in the last 168 hours. No results for input(s): AMMONIA in the last 168 hours.  CBC: Recent Labs  Lab 05/29/18 1133  WBC 5.9  HGB 13.9  HCT 41.5  MCV 90.3  PLT 253    Cardiac Enzymes: Recent Labs  Lab 05/29/18 1133  TROPONINI <0.03    BNP: Invalid input(s): POCBNP  CBG: No results for input(s): GLUCAP in the last 168 hours.  Microbiology: Results for orders placed or performed in visit on 04/12/18  Microscopic Examination     Status: Abnormal   Collection Time: 04/12/18  2:02 PM  Result Value Ref Range Status  WBC, UA None seen 0 - 5 /hpf Final   RBC, UA 3-10 (A) 0 - 2 /hpf Final   Epithelial Cells (non renal) None seen 0 - 10 /hpf Final   Mucus, UA Present (A) Not Estab. Final   Bacteria, UA Few (A) None seen/Few Final  CULTURE, URINE COMPREHENSIVE     Status: None   Collection Time: 04/12/18  2:31 PM  Result Value Ref Range Status   Urine Culture, Comprehensive Final report  Final   Organism ID, Bacteria Comment  Final    Comment: No growth in 36 - 48 hours.    Coagulation Studies: No results for input(s): LABPROT, INR in the last 72 hours.  Urinalysis:  Recent Labs  Lab 05/29/18 1133  COLORURINE YELLOW*  LABSPEC 1.032*  PHURINE 5.0  GLUCOSEU NEGATIVE  HGBUR SMALL*  BILIRUBINUR NEGATIVE  KETONESUR 5*   PROTEINUR NEGATIVE  NITRITE NEGATIVE  LEUKOCYTESUR NEGATIVE    Lipid Panel:    Component Value Date/Time   CHOL 198 05/24/2018 1602   TRIG 86.0 05/24/2018 1602   HDL 49.70 05/24/2018 1602   CHOLHDL 4 05/24/2018 1602   VLDL 17.2 05/24/2018 1602   LDLCALC 131 (H) 05/24/2018 1602    HgbA1C:  Lab Results  Component Value Date   HGBA1C 6.4 05/24/2018    Urine Drug Screen:  No results found for: LABOPIA, COCAINSCRNUR, LABBENZ, AMPHETMU, THCU, LABBARB  Alcohol Level: No results for input(s): ETH in the last 168 hours.   Imaging: Dg Chest 2 View  Result Date: 05/29/2018 CLINICAL DATA:  Cough. EXAM: CHEST - 2 VIEW COMPARISON:  Radiographs of October 24, 2017. FINDINGS: The heart size and mediastinal contours are within normal limits. Both lungs are clear. No pneumothorax or pleural effusion is noted. The visualized skeletal structures are unremarkable. IMPRESSION: No active cardiopulmonary disease. Electronically Signed   By: Marijo Conception, M.D.   On: 05/29/2018 13:19   Ct Head Wo Contrast  Result Date: 05/29/2018 CLINICAL DATA:  Fall, head trauma 2 days ago.  Ataxia EXAM: CT HEAD WITHOUT CONTRAST TECHNIQUE: Contiguous axial images were obtained from the base of the skull through the vertex without intravenous contrast. COMPARISON:  None. FINDINGS: Brain: Cerebral volume and ventricles normal. Extensive white matter hypodensity diffusely and bilaterally. Focal hypodensity in the right frontal parietal lobe extending to cortex could represent acute infarct. Similar hypodensity in the left frontal parietal cortex. Negative for hemorrhage. Vascular: Normal arterial flow voids Skull: Negative Sinuses/Orbits: Negative Other: None IMPRESSION: Diffuse white matter abnormality likely due to chronic microvascular ischemia. Focal areas of hypodensity in the frontal parietal cortex bilaterally could represent acute infarct or possibly edema from tumor. MRI brain without and with contrast  recommended for further evaluation Electronically Signed   By: Franchot Gallo M.D.   On: 05/29/2018 11:53   Mr Jeri Cos And Wo Contrast  Result Date: 05/29/2018 CLINICAL DATA:  Muscle weakness. Headache. Fall 3 days ago. Abnormal CT EXAM: MRI HEAD WITHOUT AND WITH CONTRAST TECHNIQUE: Multiplanar, multiecho pulse sequences of the brain and surrounding structures were obtained without and with intravenous contrast. CONTRAST:  29mL MULTIHANCE GADOBENATE DIMEGLUMINE 529 MG/ML IV SOLN COMPARISON:  CT head 05/29/2018 FINDINGS: Brain: Ventricle size normal.  Cerebral volume normal. Negative for acute infarct. Extensive changes throughout the subcortical and deep white matter bilaterally as well as in the temporal lobes bilaterally most consistent with chronic ischemia. These findings account for the abnormality on CT. Negative for hemorrhage or mass Normal enhancement postcontrast administration. No evidence  of mass lesion or tumor Vascular: Normal arterial flow voids Skull and upper cervical spine: Cervical spondylosis. Disc degeneration and spurring at C3-4 causing spinal stenosis. No skeletal mass lesion Sinuses/Orbits: Negative Other: None IMPRESSION: No acute intracranial abnormality.  No acute infarct or tumor Extensive chronic white matter disease likely due to ischemia. Electronically Signed   By: Franchot Gallo M.D.   On: 05/29/2018 14:45    Assessment: 72 y.o. male presenting with a feeling that his left side was somewhat weak.  Neurological examination is nonfocal.  Head CT performed and was concerning for underlying abnormality.  MRI of the brain performed in follow up and reviewed.  It shows evidence of small vessel ischemic changes but no acute changes.  Patient reports not taking his ASA. LDL 131, A1c 6.4.  Stroke Risk Factors - hyperlipidemia  Plan: 1. Patient encouraged to take ASA daily 2. Aggressive lipid management with target LDL<70. 3. Echocardiogram and carotid dopplers may be performed  as an outpatient   Alexis Goodell, MD Neurology [phone] 05/29/2018, 3:00 PM

## 2018-05-30 LAB — URINE CULTURE
Culture: NO GROWTH
SPECIAL REQUESTS: NORMAL

## 2018-05-31 ENCOUNTER — Telehealth: Payer: Self-pay | Admitting: Internal Medicine

## 2018-05-31 DIAGNOSIS — G4733 Obstructive sleep apnea (adult) (pediatric): Secondary | ICD-10-CM

## 2018-05-31 NOTE — Telephone Encounter (Signed)
Pt aware of results of titration study. Explained to patient we needed to give him the results before orders were placed. We made several #3 attempts to contact him and a letter was mailed out as well. Explained that DR is out of office and order will not be processed until 06/12/18. Orders placed Nothing further needed.

## 2018-05-31 NOTE — Telephone Encounter (Signed)
Patient has not gotten his machine and is due for fu   Please advise who he should call to get machine and when he needs ov

## 2018-06-01 ENCOUNTER — Ambulatory Visit (INDEPENDENT_AMBULATORY_CARE_PROVIDER_SITE_OTHER)
Admission: RE | Admit: 2018-06-01 | Discharge: 2018-06-01 | Disposition: A | Discharge status: Home / Self Care | Payer: Medicare HMO | Source: Ambulatory Visit | Attending: Internal Medicine | Admitting: Internal Medicine

## 2018-06-01 ENCOUNTER — Ambulatory Visit (INDEPENDENT_AMBULATORY_CARE_PROVIDER_SITE_OTHER): Payer: Medicare HMO | Admitting: Internal Medicine

## 2018-06-01 ENCOUNTER — Encounter: Payer: Self-pay | Admitting: Internal Medicine

## 2018-06-01 VITALS — BP 128/86 | HR 76 | Temp 97.8°F | Wt 259.0 lb

## 2018-06-01 DIAGNOSIS — M545 Low back pain: Secondary | ICD-10-CM

## 2018-06-01 DIAGNOSIS — R202 Paresthesia of skin: Secondary | ICD-10-CM

## 2018-06-01 DIAGNOSIS — R7303 Prediabetes: Secondary | ICD-10-CM

## 2018-06-01 DIAGNOSIS — K219 Gastro-esophageal reflux disease without esophagitis: Secondary | ICD-10-CM

## 2018-06-01 DIAGNOSIS — G8929 Other chronic pain: Secondary | ICD-10-CM

## 2018-06-01 DIAGNOSIS — R531 Weakness: Secondary | ICD-10-CM | POA: Diagnosis not present

## 2018-06-01 DIAGNOSIS — R1310 Dysphagia, unspecified: Secondary | ICD-10-CM | POA: Diagnosis not present

## 2018-06-01 DIAGNOSIS — E7800 Pure hypercholesterolemia, unspecified: Secondary | ICD-10-CM | POA: Diagnosis not present

## 2018-06-01 DIAGNOSIS — R1013 Epigastric pain: Secondary | ICD-10-CM | POA: Diagnosis not present

## 2018-06-01 DIAGNOSIS — R55 Syncope and collapse: Secondary | ICD-10-CM

## 2018-06-01 LAB — TSH: TSH: 1.89 u[IU]/mL (ref 0.35–4.50)

## 2018-06-01 MED ORDER — ROSUVASTATIN CALCIUM 40 MG PO TABS: 40.0000 mg | ORAL_TABLET | Freq: Every day | ORAL | 2 refills | Status: AC

## 2018-06-01 NOTE — Patient Instructions (Signed)
Fat and Cholesterol Restricted Diet Getting too much fat and cholesterol in your diet may cause health problems. Following this diet helps keep your fat and cholesterol at normal levels. This can keep you from getting sick. What types of fat should I choose?  Choose monosaturated and polyunsaturated fats. These are found in foods such as olive oil, canola oil, flaxseeds, walnuts, almonds, and seeds.  Eat more omega-3 fats. Good choices include salmon, mackerel, sardines, tuna, flaxseed oil, and ground flaxseeds.  Limit saturated fats. These are in animal products such as meats, butter, and cream. They can also be in plant products such as palm oil, palm kernel oil, and coconut oil.  Avoid foods with partially hydrogenated oils in them. These contain trans fats. Examples of foods that have trans fats are stick margarine, some tub margarines, cookies, crackers, and other baked goods. What general guidelines do I need to follow?  Check food labels. Look for the words "trans fat" and "saturated fat."  When preparing a meal: ? Fill half of your plate with vegetables and green salads. ? Fill one fourth of your plate with whole grains. Look for the word "whole" as the first word in the ingredient list. ? Fill one fourth of your plate with lean protein foods.  Eat more foods that have fiber, like apples, carrots, beans, peas, and barley.  Eat more home-cooked foods. Eat less at restaurants and buffets.  Limit or avoid alcohol.  Limit foods high in starch and sugar.  Limit fried foods.  Cook foods without frying them. Baking, boiling, grilling, and broiling are all great options.  Lose weight if you are overweight. Losing even a small amount of weight can help your overall health. It can also help prevent diseases such as diabetes and heart disease. What foods can I eat? Grains Whole grains, such as whole wheat or whole grain breads, crackers, cereals, and pasta. Unsweetened oatmeal,  bulgur, barley, quinoa, or brown rice. Corn or whole wheat flour tortillas. Vegetables Fresh or frozen vegetables (raw, steamed, roasted, or grilled). Green salads. Fruits All fresh, canned (in natural juice), or frozen fruits. Meat and Other Protein Products Ground beef (85% or leaner), grass-fed beef, or beef trimmed of fat. Skinless chicken or turkey. Ground chicken or turkey. Pork trimmed of fat. All fish and seafood. Eggs. Dried beans, peas, or lentils. Unsalted nuts or seeds. Unsalted canned or dry beans. Dairy Low-fat dairy products, such as skim or 1% milk, 2% or reduced-fat cheeses, low-fat ricotta or cottage cheese, or plain low-fat yogurt. Fats and Oils Tub margarines without trans fats. Light or reduced-fat mayonnaise and salad dressings. Avocado. Olive, canola, sesame, or safflower oils. Natural peanut or almond butter (choose ones without added sugar and oil). The items listed above may not be a complete list of recommended foods or beverages. Contact your dietitian for more options. What foods are not recommended? Grains White bread. White pasta. White rice. Cornbread. Bagels, pastries, and croissants. Crackers that contain trans fat. Vegetables White potatoes. Corn. Creamed or fried vegetables. Vegetables in a cheese sauce. Fruits Dried fruits. Canned fruit in light or heavy syrup. Fruit juice. Meat and Other Protein Products Fatty cuts of meat. Ribs, chicken wings, bacon, sausage, bologna, salami, chitterlings, fatback, hot dogs, bratwurst, and packaged luncheon meats. Liver and organ meats. Dairy Whole or 2% milk, cream, half-and-half, and cream cheese. Whole milk cheeses. Whole-fat or sweetened yogurt. Full-fat cheeses. Nondairy creamers and whipped toppings. Processed cheese, cheese spreads, or cheese curds. Sweets and Desserts Corn   syrup, sugars, honey, and molasses. Candy. Jam and jelly. Syrup. Sweetened cereals. Cookies, pies, cakes, donuts, muffins, and ice  cream. Fats and Oils Butter, stick margarine, lard, shortening, ghee, or bacon fat. Coconut, palm kernel, or palm oils. Beverages Alcohol. Sweetened drinks (such as sodas, lemonade, and fruit drinks or punches). The items listed above may not be a complete list of foods and beverages to avoid. Contact your dietitian for more information. This information is not intended to replace advice given to you by your health care provider. Make sure you discuss any questions you have with your health care provider. Document Released: 04/18/2012 Document Revised: 06/24/2016 Document Reviewed: 01/17/2014 Elsevier Interactive Patient Education  2018 [employer] Inc[employer]  

## 2018-06-01 NOTE — Progress Notes (Signed)
Subjective:    Patient ID: Timothy Alvarado, male    DOB: Aug 05, 1946, 72 y.o.   MRN: [id number]  HPI  Pt presents to the clinic today for ER follow up. He went to the ER 7/29 with c/o left sided weakness, syncope s/p coughing spell, epigastric pain and reflux. ECG was normal. Chest xray was negative for infectious process. CT head was concerning for a possible acute infarct vs edema from a tumor but MRI brain was negative. He was started on ASA and advised to follow up with neurology as an outpatient. They advised him to follow up with PCP re: cough, epigastric pain and reflux. Since discharge, he reports persistent difficulty swallowing and epigastric pain. He denies nausea, vomiting, constipation or blood in his stool. He is already taking Pantoprazole and Ranitidine as prescribed. There is no upper GI on file. He tested negative for H Pylori within the last year. He continues to have left sided weakness, and is not sure if he has a follow up with neurology or not. He will look at his discharge paperwork when he gets home. He has never had a stroke that he is aware of. He reports chronic low back pain. He describes the pain as sore and achy. He reports a burning sensation in his bilateral legs, but denies numbness, tingling, swelling or difficulty with gait. He has not tried anything OTC for any of these symptoms,.  He also wants to discuss his recent A1C of 6.4%, what this means and how he can improve it.  He also needs a refill of Rosuvastatin today.  Review of Systems      Past Medical History:  Diagnosis Date  . Allergy   . GERD (gastroesophageal reflux disease)   . Glaucoma   . Hyperlipidemia     Current Outpatient Medications  Medication Sig Dispense Refill  . acetaminophen (TYLENOL) 500 MG tablet Take 1 tablet (500 mg total) by mouth every 6 (six) hours as needed. 30 tablet 0  . aspirin 81 MG tablet Take 81 mg by mouth daily as needed for pain.     Marland Kitchen aspirin-acetaminophen-caffeine  (EXCEDRIN MIGRAINE) 250-250-65 MG tablet Take 2 tablets by mouth every 6 (six) hours as needed for headache.    . bimatoprost (LUMIGAN) 0.01 % SOLN Place 1 drop into both eyes at bedtime.    . fluticasone (FLONASE) 50 MCG/ACT nasal spray Place 2 sprays into both nostrils daily. 16 g 3  . latanoprost (XALATAN) 0.005 % ophthalmic solution INSTILL 1 DROP INTO AFFECTED EYE EVERY DAY AS DIRECTED  6  . naproxen (NAPROSYN) 500 MG tablet Take 500 mg by mouth 2 (two) times daily with a meal.    . pantoprazole (PROTONIX) 40 MG tablet Take 1 tablet (40 mg total) by mouth daily. 30 tablet 2  . pregabalin (LYRICA) 100 MG capsule Take 1 capsule (100 mg total) by mouth 2 (two) times daily. 60 capsule 2  . ranitidine (ZANTAC) 150 MG tablet Take 150 mg by mouth daily as needed for heartburn.    . rosuvastatin (CRESTOR) 40 MG tablet Take 1 tablet (40 mg total) by mouth daily. MUST SCHEDULE LAB VISIT 30 tablet 0  . tamsulosin (FLOMAX) 0.4 MG CAPS capsule Take 1 capsule (0.4 mg total) by mouth daily. 30 capsule 11  . tiZANidine (ZANAFLEX) 4 MG tablet Take 1 tablet (4 mg total) by mouth 3 (three) times daily as needed for muscle spasms. 30 tablet 0  . vitamin B-12 (CYANOCOBALAMIN) 100 MCG tablet Take 100  mcg by mouth daily.     No current facility-administered medications for this visit.     Allergies  Allergen Reactions  . Percocet [Oxycodone-Acetaminophen] Other (See Comments)    hallucinations    Family History  Problem Relation Age of Onset  . Breast cancer Sister   . Prostate cancer Brother   . Hyperlipidemia Brother     Social History   Socioeconomic History  . Marital status: Married    Spouse name: Not on file  . Number of children: Not on file  . Years of education: Not on file  . Highest education level: Not on file  Occupational History  . Not on file  Social Needs  . Financial resource strain: Not on file  . Food insecurity:    Worry: Not on file    Inability: Not on file  .  Transportation needs:    Medical: Not on file    Non-medical: Not on file  Tobacco Use  . Smoking status: Never Smoker  . Smokeless tobacco: Never Used  Substance and Sexual Activity  . Alcohol use: Yes    Comment: occasional beer  . Drug use: No  . Sexual activity: Never  Lifestyle  . Physical activity:    Days per week: Not on file    Minutes per session: Not on file  . Stress: Not on file  Relationships  . Social connections:    Talks on phone: Not on file    Gets together: Not on file    Attends religious service: Not on file    Active member of club or organization: Not on file    Attends meetings of clubs or organizations: Not on file    Relationship status: Not on file  . Intimate partner violence:    Fear of current or ex partner: Not on file    Emotionally abused: Not on file    Physically abused: Not on file    Forced sexual activity: Not on file  Other Topics Concern  . Not on file  Social History Narrative  . Not on file     Constitutional: Pt reports weight loss. Denies fever, malaise, fatigue, headache.  HEENT: Denies eye pain, eye redness, ear pain, ringing in the ears, wax buildup, runny nose, nasal congestion, bloody nose, or sore throat. Respiratory: Denies difficulty breathing, shortness of breath, cough or sputum production.   Cardiovascular: Denies chest pain, chest tightness, palpitations or swelling in the hands or feet.  Gastrointestinal: Pt reports epigastric pain, difficulty swallowing. Denies bloating, constipation, diarrhea or blood in the stool.  Musculoskeletal: Pt reports left sided weakness, low back pain. Denies decrease in range of motion, difficulty with gait, muscle pain or joint pain and swelling.  Neurological: Pt reports burning sensation in legs. Denies dizziness, difficulty with memory, difficulty with speech or problems with balance and coordination.    No other specific complaints in a complete review of systems (except as listed  in HPI above).  Objective:   Physical Exam   BP 128/86   Pulse 76   Temp 97.8 F (36.6 C) (Oral)   Wt 259 lb (117.5 kg)   SpO2 98%   BMI 32.37 kg/m  Wt Readings from Last 3 Encounters:  06/01/18 259 lb (117.5 kg)  05/29/18 269 lb (122 kg)  05/24/18 269 lb (122 kg)    General: Appears his stated age, obese in NAD. Skin: Warm, dry and intact. No ulcerations noted. Neck:  Neck supple, trachea midline. No masses, lumps  or thyromegaly present.  Cardiovascular: Normal rate and rhythm. S1,S2 noted.  No murmur, rubs or gallops noted. No JVD or BLE edema.  Pulmonary/Chest: Normal effort and positive vesicular breath sounds. No respiratory distress. No wheezes, rales or ronchi noted.  Abdomen: Soft and tender in the epigastric region. Normal bowel sounds. No distention or masses noted.  Musculoskeletal: Normal flexion, extension and rotation of the spine. No bony tenderness noted over the lumbar spine. Strength 4/5 LUE/LLE. Strength 5/5 RUE/RLE. Hand grips equal. Able to walk on toes and heels. No difficulty with gait.  Neurological: Alert and oriented. Cranial nerves II-XII grossly intact. Coordination normal. Sensation intact to BUE/BLE.   BMET    Component Value Date/Time   NA 141 05/29/2018 1133   K 4.0 05/29/2018 1133   CL 107 05/29/2018 1133   CO2 25 05/29/2018 1133   GLUCOSE 108 (H) 05/29/2018 1133   BUN 16 05/29/2018 1133   CREATININE 1.23 05/29/2018 1133   CALCIUM 8.9 05/29/2018 1133   GFRNONAA 58 (L) 05/29/2018 1133   GFRAA >60 05/29/2018 1133    Lipid Panel     Component Value Date/Time   CHOL 198 05/24/2018 1602   TRIG 86.0 05/24/2018 1602   HDL 49.70 05/24/2018 1602   CHOLHDL 4 05/24/2018 1602   VLDL 17.2 05/24/2018 1602   LDLCALC 131 (H) 05/24/2018 1602    CBC    Component Value Date/Time   WBC 5.9 05/29/2018 1133   RBC 4.60 05/29/2018 1133   HGB 13.9 05/29/2018 1133   HCT 41.5 05/29/2018 1133   PLT 253 05/29/2018 1133   MCV 90.3 05/29/2018 1133    MCH 30.3 05/29/2018 1133   MCHC 33.6 05/29/2018 1133   RDW 16.3 (H) 05/29/2018 1133    Hgb A1C Lab Results  Component Value Date   HGBA1C 6.4 05/24/2018           Assessment & Plan:   ER Follow Up for Dysphagia, Epigastric Pain, GERD, Left Sided Weakness, Syncopal Episodes:  ER notes, labs and imaging reviewed He will follow up with neurology given his persistent weakness Referral to GI for possible upper endoscopy for evaluation of dysphagia Continue Pantoprazole and Ranitidine Will check TSH today  HLD:  Rosuvastatin refilled Repeat lipid and CMET in 3 months, lab only  Chronic Low Back Pain, Paresthesia of BLE:  Vit B12 and Vit D reviewed- no need to repeat TSH today Xray of lumbar spine today May need MRI of spine  Borderline Diabetes:  Discussed low carb diet and exercise for weight loss Handout given on diabetic diet Repeat A1C in 3 months  RTC in 3 months for repeat A1C, Lipid and CMET (lab only) Webb Silversmith, NP

## 2018-06-02 ENCOUNTER — Telehealth: Payer: Self-pay

## 2018-06-02 ENCOUNTER — Encounter: Payer: Self-pay | Admitting: Internal Medicine

## 2018-06-02 NOTE — Telephone Encounter (Signed)
PA has been submitted vis covermymeds and was approved   [id number]   Status:Approved  Coverage Start Date:05/03/2018;Coverage End Date:06/02/2019  Will fax to the pharmacy

## 2018-06-04 ENCOUNTER — Emergency Department: Payer: Medicare HMO

## 2018-06-04 ENCOUNTER — Emergency Department
Admission: EM | Admit: 2018-06-04 | Discharge: 2018-06-04 | Disposition: A | Discharge status: Home / Self Care | Payer: Medicare HMO | Attending: Student in an Organized Health Care Education/Training Program | Admitting: Student in an Organized Health Care Education/Training Program

## 2018-06-04 ENCOUNTER — Other Ambulatory Visit: Payer: Self-pay

## 2018-06-04 ENCOUNTER — Encounter: Payer: Self-pay | Admitting: Emergency Medicine

## 2018-06-04 DIAGNOSIS — R079 Chest pain, unspecified: Secondary | ICD-10-CM | POA: Diagnosis not present

## 2018-06-04 DIAGNOSIS — Z79899 Other long term (current) drug therapy: Secondary | ICD-10-CM | POA: Diagnosis not present

## 2018-06-04 DIAGNOSIS — R0789 Other chest pain: Secondary | ICD-10-CM

## 2018-06-04 DIAGNOSIS — R05 Cough: Secondary | ICD-10-CM | POA: Diagnosis not present

## 2018-06-04 DIAGNOSIS — R0602 Shortness of breath: Secondary | ICD-10-CM | POA: Diagnosis not present

## 2018-06-04 DIAGNOSIS — Z7982 Long term (current) use of aspirin: Secondary | ICD-10-CM | POA: Insufficient documentation

## 2018-06-04 DIAGNOSIS — R42 Dizziness and giddiness: Secondary | ICD-10-CM | POA: Diagnosis not present

## 2018-06-04 DIAGNOSIS — G4489 Other headache syndrome: Secondary | ICD-10-CM | POA: Diagnosis not present

## 2018-06-04 DIAGNOSIS — R0902 Hypoxemia: Secondary | ICD-10-CM | POA: Diagnosis not present

## 2018-06-04 LAB — CBC WITH DIFFERENTIAL/PLATELET
Basophils Absolute: 0 10*3/uL (ref 0–0.1)
Basophils Relative: 1 %
Eosinophils Absolute: 0.1 10*3/uL (ref 0–0.7)
Eosinophils Relative: 2 %
HEMATOCRIT: 39.5 % — AB (ref 40.0–52.0)
HEMOGLOBIN: 13.3 g/dL (ref 13.0–18.0)
LYMPHS ABS: 2.1 10*3/uL (ref 1.0–3.6)
Lymphocytes Relative: 34 %
MCH: 30.5 pg (ref 26.0–34.0)
MCHC: 33.7 g/dL (ref 32.0–36.0)
MCV: 90.4 fL (ref 80.0–100.0)
MONOS PCT: 11 %
Monocytes Absolute: 0.7 10*3/uL (ref 0.2–1.0)
NEUTROS ABS: 3.2 10*3/uL (ref 1.4–6.5)
NEUTROS PCT: 52 %
Platelets: 230 10*3/uL (ref 150–440)
RBC: 4.38 MIL/uL — ABNORMAL LOW (ref 4.40–5.90)
RDW: 16.3 % — ABNORMAL HIGH (ref 11.5–14.5)
WBC: 6.1 10*3/uL (ref 3.8–10.6)

## 2018-06-04 LAB — TROPONIN I
Troponin I: <0.03 ng/mL (ref <0.03)
Troponin I: <0.03 ng/mL (ref <0.03)

## 2018-06-04 LAB — BRAIN NATRIURETIC PEPTIDE: B Natriuretic Peptide: 22 pg/mL (ref 0.0–100.0)

## 2018-06-04 LAB — COMPREHENSIVE METABOLIC PANEL
ALK PHOS: 45 U/L (ref 38–126)
ALT: 25 U/L (ref 0–44)
AST: 24 U/L (ref 15–41)
Albumin: 3.8 g/dL (ref 3.5–5.0)
Anion gap: 5 (ref 5–15)
BILIRUBIN TOTAL: 1.2 mg/dL (ref 0.3–1.2)
BUN: 15 mg/dL (ref 8–23)
CALCIUM: 8.7 mg/dL — AB (ref 8.9–10.3)
CO2: 24 mmol/L (ref 22–32)
Chloride: 109 mmol/L (ref 98–111)
Creatinine, Ser: 1.11 mg/dL (ref 0.61–1.24)
GLUCOSE: 115 mg/dL — AB (ref 70–99)
Potassium: 3.6 mmol/L (ref 3.5–5.1)
Sodium: 138 mmol/L (ref 135–145)
TOTAL PROTEIN: 7.3 g/dL (ref 6.5–8.1)

## 2018-06-04 MED ORDER — IOPAMIDOL (ISOVUE-370) INJECTION 76%
75.0000 mL | Freq: Once | INTRAVENOUS | Status: AC | PRN
  Administered 2018-06-04: 75 mL via INTRAVENOUS

## 2018-06-04 MED ORDER — RANITIDINE HCL 150 MG PO TABS: 150.0000 mg | ORAL_TABLET | Freq: Two times a day (BID) | ORAL | 1 refills | Status: AC

## 2018-06-04 MED ORDER — SUCRALFATE 1 G PO TABS
1.0000 g | ORAL_TABLET | Freq: Once | ORAL | Status: AC
  Administered 2018-06-04: 1 g via ORAL
  Filled 2018-06-04: qty 1

## 2018-06-04 MED ORDER — GI COCKTAIL ~~LOC~~
30.0000 mL | Freq: Once | ORAL | Status: AC
  Administered 2018-06-04: 30 mL via ORAL
  Filled 2018-06-04: qty 30

## 2018-06-04 MED ORDER — ACETAMINOPHEN 500 MG PO TABS
1000.0000 mg | ORAL_TABLET | Freq: Once | ORAL | Status: AC
  Administered 2018-06-04: 1000 mg via ORAL
  Filled 2018-06-04: qty 2

## 2018-06-04 NOTE — ED Notes (Signed)
Patient returned from radiology

## 2018-06-04 NOTE — ED Notes (Signed)
ED Provider at bedside. 

## 2018-06-04 NOTE — ED Triage Notes (Signed)
Pt presents via [facility] with c/o chest pain, headache, and dizziness that began around 7am this morning. Pt described pain as dull and aching. Pt reported that he started coughing this morning and almost had LOC. Pt has hx of GERD and given 325 aspirin and 3 nitro. Pt chest pain improved with nitro.

## 2018-06-04 NOTE — ED Provider Notes (Signed)
[facility] Emergency Department Provider Note    First MD Initiated Contact with Patient 06/04/18 1801     (approximate)  I have reviewed the triage vital signs and the nursing notes.   HISTORY  Chief Complaint Chest Pain; Dizziness; and Near Syncope    HPI Timothy Alvarado is a 72 y.o. male with a history of reflux and hyperlipidemia presents the ER via EMS after call out for midsternal chest discomfort lightheadedness occurred around 7 AM this morning.  Described as dull and achy.  Denies any chest pain at this time.  Was given aspirin and 3 nitro with improvement in chest pain.  States he was having headache at that time to.  EMS reported that he was hypoxic on room air but he is 98% on room air on arrival.  States he has had similar headaches in the past.  Has never had chest pain like that quite before.  He does endorse epigastric discomfort for which he has been given referral to GI but has not followed up with them about this yet.    Past Medical History:  Diagnosis Date  . Allergy   . GERD (gastroesophageal reflux disease)   . Glaucoma   . Hyperlipidemia    Family History  Problem Relation Age of Onset  . Breast cancer Sister   . Prostate cancer Brother   . Hyperlipidemia Brother    Past Surgical History:  Procedure Laterality Date  . BACK SURGERY     lumbar   . ROTATOR CUFF REPAIR Left    Patient Active Problem List   Diagnosis Date Noted  . Elevated PSA 01/26/2018  . Benign prostatic hyperplasia with lower urinary tract symptoms 01/26/2018  . Seasonal allergies 05/10/2017  . GERD (gastroesophageal reflux disease) 05/10/2017  . HLD (hyperlipidemia) 05/10/2017  . Glaucoma 05/10/2017  . Borderline diabetes 05/10/2017  . Neuropathy 05/10/2017      Prior to Admission medications   Medication Sig Start Date End Date Taking? Authorizing Provider  acetaminophen (TYLENOL) 500 MG tablet Take 1 tablet (500 mg total) by mouth every 6 (six)  hours as needed. 10/24/17   Shary Decamp, PA-C  aspirin 81 MG tablet Take 81 mg by mouth daily as needed for pain.     [provider]  aspirin-acetaminophen-caffeine (EXCEDRIN MIGRAINE) [phone] MG tablet Take 2 tablets by mouth every 6 (six) hours as needed for headache.    [provider]  bimatoprost (LUMIGAN) 0.01 % SOLN Place 1 drop into both eyes at bedtime.    [provider]  fluticasone (FLONASE) 50 MCG/ACT nasal spray Place 2 sprays into both nostrils daily. 01/20/18   Ria Bush, MD  latanoprost (XALATAN) 0.005 % ophthalmic solution INSTILL 1 DROP INTO AFFECTED EYE EVERY DAY AS DIRECTED 03/28/18   [provider]  naproxen (NAPROSYN) 500 MG tablet Take 500 mg by mouth 2 (two) times daily with a meal.    [provider]  pantoprazole (PROTONIX) 40 MG tablet Take 1 tablet (40 mg total) by mouth daily. 05/24/18   Jearld Fenton, NP  pregabalin (LYRICA) 100 MG capsule Take 1 capsule (100 mg total) by mouth 2 (two) times daily. 05/24/18   Jearld Fenton, NP  ranitidine (ZANTAC) 150 MG tablet Take 150 mg by mouth daily as needed for heartburn.    [provider]  ranitidine (ZANTAC) 150 MG tablet Take 1 tablet (150 mg total) by mouth 2 (two) times daily. 06/04/18 06/04/19  Merlyn Lot, MD  rosuvastatin (CRESTOR) 40 MG tablet Take 1 tablet (40 mg total) by mouth daily. MUST SCHEDULE LAB VISIT 06/01/18   Jearld Fenton, NP  tamsulosin (FLOMAX) 0.4 MG CAPS capsule Take 1 capsule (0.4 mg total) by mouth daily. 03/10/18   Stoioff, Ronda Fairly, MD  tiZANidine (ZANAFLEX) 4 MG tablet Take 1 tablet (4 mg total) by mouth 3 (three) times daily as needed for muscle spasms. 05/24/18   Jearld Fenton, NP  vitamin B-12 (CYANOCOBALAMIN) 100 MCG tablet Take 100 mcg by mouth daily.    [provider]    Allergies Percocet [oxycodone-acetaminophen]    Social History Social History   Tobacco Use  . Smoking status: Never Smoker  . Smokeless  tobacco: Never Used  Substance Use Topics  . Alcohol use: Yes    Comment: occasional beer  . Drug use: No    Review of Systems Patient denies headaches, rhinorrhea, blurry vision, numbness, shortness of breath, chest pain, edema, cough, abdominal pain, nausea, vomiting, diarrhea, dysuria, fevers, rashes or hallucinations unless otherwise stated above in HPI. ____________________________________________   PHYSICAL EXAM:  VITAL SIGNS: Vitals:   06/04/18 1900 06/04/18 1930  BP: 120/73 (!) 105/94  Pulse: (!) 55 (!) 57  Resp: (!) 21 15  Temp:    SpO2: 97% 97%    Constitutional: Alert and oriented.  Eyes: Conjunctivae are normal.  Head: Atraumatic. Nose: No congestion/rhinnorhea. Mouth/Throat: Mucous membranes are moist.   Neck: No stridor. Painless ROM.  Cardiovascular: Normal rate, regular rhythm. Grossly normal heart sounds.  Good peripheral circulation. Respiratory: Normal respiratory effort.  No retractions. Lungs CTAB. Gastrointestinal: Soft and nontender. No distention. No abdominal bruits. No CVA tenderness. Genitourinary:  Musculoskeletal: No lower extremity tenderness nor edema.  No joint effusions. Neurologic:  Normal speech and language. No gross focal neurologic deficits are appreciated. No facial droop Skin:  Skin is warm, dry and intact. No rash noted. Psychiatric: Mood and affect are normal. Speech and behavior are normal.  ____________________________________________   LABS (all labs ordered are listed, but only abnormal results are displayed)  Results for orders placed or performed during the hospital encounter of 06/04/18 (from the past 24 hour(s))  CBC with Differential/Platelet     Status: Abnormal   Collection Time: 06/04/18  6:16 PM  Result Value Ref Range   WBC 6.1 3.8 - 10.6 K/uL   RBC 4.38 (L) 4.40 - 5.90 MIL/uL   Hemoglobin 13.3 13.0 - 18.0 g/dL   HCT 39.5 (L) 40.0 - 52.0 %   MCV 90.4 80.0 - 100.0 fL   MCH 30.5 26.0 - 34.0 pg   MCHC 33.7 32.0  - 36.0 g/dL   RDW 16.3 (H) 11.5 - 14.5 %   Platelets 230 150 - 440 K/uL   Neutrophils Relative % 52 %   Neutro Abs 3.2 1.4 - 6.5 K/uL   Lymphocytes Relative 34 %   Lymphs Abs 2.1 1.0 - 3.6 K/uL   Monocytes Relative 11 %   Monocytes Absolute 0.7 0.2 - 1.0 K/uL   Eosinophils Relative 2 %   Eosinophils Absolute 0.1 0 - 0.7 K/uL   Basophils Relative 1 %   Basophils Absolute 0.0 0 - 0.1 K/uL  Comprehensive metabolic panel     Status: Abnormal   Collection Time: 06/04/18  6:16 PM  Result Value Ref Range   Sodium 138 135 - 145 mmol/L   Potassium 3.6 3.5 - 5.1 mmol/L   Chloride 109 98 - 111 mmol/L   CO2 24 22 -  32 mmol/L   Glucose, Bld 115 (H) 70 - 99 mg/dL   BUN 15 8 - 23 mg/dL   Creatinine, Ser 1.11 0.61 - 1.24 mg/dL   Calcium 8.7 (L) 8.9 - 10.3 mg/dL   Total Protein 7.3 6.5 - 8.1 g/dL   Albumin 3.8 3.5 - 5.0 g/dL   AST 24 15 - 41 U/L   ALT 25 0 - 44 U/L   Alkaline Phosphatase 45 38 - 126 U/L   Total Bilirubin 1.2 0.3 - 1.2 mg/dL   GFR calc non Af Amer >60 >60 mL/min   GFR calc Af Amer >60 >60 mL/min   Anion gap 5 5 - 15  Troponin I     Status: None   Collection Time: 06/04/18  6:16 PM  Result Value Ref Range   Troponin I <0.03 <0.03 ng/mL  Brain natriuretic peptide     Status: None   Collection Time: 06/04/18  6:16 PM  Result Value Ref Range   B Natriuretic Peptide 22.0 0.0 - 100.0 pg/mL   ____________________________________________  EKG My review and personal interpretation at Time: 18:07   Indication: chest pain  Rate: normal  Rhythm: normal Axis: nromal Other: normal intervals, no stemi ____________________________________________  RADIOLOGY  I personally reviewed all radiographic images ordered to evaluate for the above acute complaints and reviewed radiology reports and findings.  These findings were personally discussed with the patient.  Please see medical record for radiology  report.  ____________________________________________   PROCEDURES  Procedure(s) performed:  Procedures    Critical Care performed: no ____________________________________________   INITIAL IMPRESSION / ASSESSMENT AND PLAN / ED COURSE  Pertinent labs & imaging results that were available during my care of the patient were reviewed by me and considered in my medical decision making (see chart for details).   DDX: ACS, pericarditis, esophagitis, boerhaaves, pe, dissection, pna, bronchitis, costochondritis   Timothy Alvarado is a 72 y.o. who presents to the ED with his as described above.  Does have history of reflux but patient also with risk factors for ACS.  Initial EKG shows no ischemic changes and initial troponin is negative.  Blood pressure blood work otherwise stable.  EMS reported low O2 sat on room air will continue to observe as he is currently with normoxia.  Clinical Course as of Jun 04 2118  Nancy Fetter Jun 04, 2018  1949 Blood work thus far is reassuring.  Will order CT angiogram to evaluate for PE as the patient is still having some discomfort.   [PR]  2052 No evidence of PE on angiogram.  Will repeat troponin.   [PR]    Clinical Course User Index [PR] Merlyn Lot, MD    Will order repeat troponin.  Patient will be signed out to oncoming physician.  Assuming negative troponin patient stable and appropriate for discharge home.   As part of my medical decision making, I reviewed the following data within the [facility] notes reviewed and incorporated, Labs reviewed, notes from prior ED visits.  ____________________________________________   FINAL CLINICAL IMPRESSION(S) / ED DIAGNOSES  Final diagnoses:  Chest discomfort      NEW MEDICATIONS STARTED DURING THIS VISIT:  New Prescriptions   RANITIDINE (ZANTAC) 150 MG TABLET    Take 1 tablet (150 mg total) by mouth 2 (two) times daily.     Note:  This document was prepared using  Dragon voice recognition software and may include unintentional dictation errors.    Merlyn Lot, MD 06/04/18 2119

## 2018-06-04 NOTE — ED Notes (Signed)
X-ray at bedside

## 2018-06-04 NOTE — ED Notes (Signed)
Patient transported to CT 

## 2018-06-04 NOTE — ED Provider Notes (Signed)
Signout from Dr. Quentin Cornwall in this 72 year old male with likely GI related chest pain.  Pending second troponin at this time.  Physical Exam  BP (!) 148/81   Pulse (!) 57   Temp 98.3 F (36.8 C) (Oral)   Resp 20   Ht 6\' 3"  (1.905 m)   Wt 117.5 kg (259 lb)   SpO2 93%   BMI 32.37 kg/m  ----------------------------------------- 10:13 PM on 06/04/2018 -----------------------------------------   Physical Exam Patient calm.  No distress. ED Course/Procedures   Clinical Course as of Jun 05 2211  Nancy Fetter Jun 04, 2018  1949 Blood work thus far is reassuring.  Will order CT angiogram to evaluate for PE as the patient is still having some discomfort.   [PR]  2052 No evidence of PE on angiogram.  Will repeat troponin.   [PR]    Clinical Course User Index [PR] Merlyn Lot, MD    Procedures  MDM  Second troponin is negative.  Patient to be discharged.  Patient understanding the plan and willing to comply.      Orbie Pyo, MD 06/04/18 2213

## 2018-06-05 ENCOUNTER — Telehealth: Payer: Self-pay

## 2018-06-05 NOTE — Telephone Encounter (Signed)
Unable to reach pt to find out what kind of throat blockage pt has. No answer and v/m not set up. Called x 2.

## 2018-06-05 NOTE — Telephone Encounter (Signed)
Copied from [place] [id number]. Topic: Appointment Scheduling - Scheduling Inquiry for Clinic >> Jun 05, 2018  4:38 PM Oliver Pila B wrote: Reason for CRM: pt is wanting an appt b/c of throat blockage; pt is trying to get in earlier due to pcp's schedule, contact to advise

## 2018-06-06 ENCOUNTER — Ambulatory Visit (INDEPENDENT_AMBULATORY_CARE_PROVIDER_SITE_OTHER): Payer: Medicare HMO | Admitting: Internal Medicine

## 2018-06-06 ENCOUNTER — Encounter: Payer: Self-pay | Admitting: Internal Medicine

## 2018-06-06 VITALS — BP 130/80 | HR 93 | Temp 98.3°F | Wt 259.0 lb

## 2018-06-06 DIAGNOSIS — J029 Acute pharyngitis, unspecified: Secondary | ICD-10-CM | POA: Diagnosis not present

## 2018-06-06 DIAGNOSIS — R1310 Dysphagia, unspecified: Secondary | ICD-10-CM

## 2018-06-06 DIAGNOSIS — K5900 Constipation, unspecified: Secondary | ICD-10-CM

## 2018-06-06 MED ORDER — MAGIC MOUTHWASH W/LIDOCAINE: 5.0000 mL | Freq: Four times a day (QID) | ORAL | 0 refills | Status: DC | PRN

## 2018-06-06 NOTE — Progress Notes (Signed)
Subjective:    Patient ID: Timothy Alvarado, male    DOB: 1946-03-10, 72 y.o.   MRN: [id number]  HPI  Pt presents to the clinic today to follow up sore throat and dysphagia. This has been an ongoing issue. He reports constant sore throat, difficulty swallowing, feeling like things are getting stuck on the way down (but not choking), reflux and constipation. He is taking Pantoprazole daily, and recently increased Ranitidine to 2 x day. We checked his thyroid to make sure this wasn't a contributing factor, levels were normal. He has an appt with GI on 9/5 for further evaluation. He has not taken anything OTC for his symptoms.   Review of Systems      Past Medical History:  Diagnosis Date  . Allergy   . GERD (gastroesophageal reflux disease)   . Glaucoma   . Hyperlipidemia     Current Outpatient Medications  Medication Sig Dispense Refill  . acetaminophen (TYLENOL) 500 MG tablet Take 1 tablet (500 mg total) by mouth every 6 (six) hours as needed. 30 tablet 0  . aspirin 81 MG tablet Take 81 mg by mouth daily as needed for pain.     Marland Kitchen aspirin-acetaminophen-caffeine (EXCEDRIN MIGRAINE) 250-250-65 MG tablet Take 2 tablets by mouth every 6 (six) hours as needed for headache.    . bimatoprost (LUMIGAN) 0.01 % SOLN Place 1 drop into both eyes at bedtime.    . fluticasone (FLONASE) 50 MCG/ACT nasal spray Place 2 sprays into both nostrils daily. 16 g 3  . latanoprost (XALATAN) 0.005 % ophthalmic solution INSTILL 1 DROP INTO AFFECTED EYE EVERY DAY AS DIRECTED  6  . naproxen (NAPROSYN) 500 MG tablet Take 500 mg by mouth 2 (two) times daily with a meal.    . pantoprazole (PROTONIX) 40 MG tablet Take 1 tablet (40 mg total) by mouth daily. 30 tablet 2  . pregabalin (LYRICA) 100 MG capsule Take 1 capsule (100 mg total) by mouth 2 (two) times daily. 60 capsule 2  . ranitidine (ZANTAC) 150 MG tablet Take 150 mg by mouth daily as needed for heartburn.    . ranitidine (ZANTAC) 150 MG tablet Take 1 tablet (150  mg total) by mouth 2 (two) times daily. 60 tablet 1  . rosuvastatin (CRESTOR) 40 MG tablet Take 1 tablet (40 mg total) by mouth daily. MUST SCHEDULE LAB VISIT 30 tablet 2  . tamsulosin (FLOMAX) 0.4 MG CAPS capsule Take 1 capsule (0.4 mg total) by mouth daily. 30 capsule 11  . tiZANidine (ZANAFLEX) 4 MG tablet Take 1 tablet (4 mg total) by mouth 3 (three) times daily as needed for muscle spasms. 30 tablet 0  . vitamin B-12 (CYANOCOBALAMIN) 100 MCG tablet Take 100 mcg by mouth daily.    . magic mouthwash w/lidocaine SOLN Take 5 mLs by mouth 4 (four) times daily as needed for mouth pain. 240 mL 0   No current facility-administered medications for this visit.     Allergies  Allergen Reactions  . Percocet [Oxycodone-Acetaminophen] Other (See Comments)    hallucinations    Family History  Problem Relation Age of Onset  . Breast cancer Sister   . Prostate cancer Brother   . Hyperlipidemia Brother     Social History   Socioeconomic History  . Marital status: Married    Spouse name: Not on file  . Number of children: Not on file  . Years of education: Not on file  . Highest education level: Not on file  Occupational History  .  Not on file  Social Needs  . Financial resource strain: Not on file  . Food insecurity:    Worry: Not on file    Inability: Not on file  . Transportation needs:    Medical: Not on file    Non-medical: Not on file  Tobacco Use  . Smoking status: Never Smoker  . Smokeless tobacco: Never Used  Substance and Sexual Activity  . Alcohol use: Yes    Comment: occasional beer  . Drug use: No  . Sexual activity: Never  Lifestyle  . Physical activity:    Days per week: Not on file    Minutes per session: Not on file  . Stress: Not on file  Relationships  . Social connections:    Talks on phone: Not on file    Gets together: Not on file    Attends religious service: Not on file    Active member of club or organization: Not on file    Attends meetings of  clubs or organizations: Not on file    Relationship status: Not on file  . Intimate partner violence:    Fear of current or ex partner: Not on file    Emotionally abused: Not on file    Physically abused: Not on file    Forced sexual activity: Not on file  Other Topics Concern  . Not on file  Social History Narrative  . Not on file     Constitutional: Denies fever, malaise, fatigue, headache or abrupt weight changes.  HEENT: Pt reports sore throat. Denies eye pain, eye redness, ear pain, ringing in the ears, wax buildup, runny nose, nasal congestion, bloody nose. Respiratory: Denies difficulty breathing, shortness of breath, cough or sputum production.   Cardiovascular: Denies chest pain, chest tightness, palpitations or swelling in the hands or feet.  Gastrointestinal: Pt reports dysphagia, constipation. Denies abdominal pain, bloating, diarrhea or blood in the stool.   No other specific complaints in a complete review of systems (except as listed in HPI above).  Objective:   Physical Exam  BP 130/80   Pulse 93   Temp 98.3 F (36.8 C) (Oral)   Wt 259 lb (117.5 kg)   SpO2 96%   BMI 32.37 kg/m  Wt Readings from Last 3 Encounters:  06/06/18 259 lb (117.5 kg)  06/04/18 259 lb (117.5 kg)  06/01/18 259 lb (117.5 kg)    General: Appears his stated age, obese in NAD. HEENT:  Throat/Mouth: Teeth present, mucosa pink and moist, no exudate, lesions or ulcerations noted.  Neck:  Neck supple, trachea midline. No masses, lumps or thyromegaly present.  Abdomen: Soft and nontender. Normal bowel sounds.  BMET    Component Value Date/Time   NA 138 06/04/2018 1816   K 3.6 06/04/2018 1816   CL 109 06/04/2018 1816   CO2 24 06/04/2018 1816   GLUCOSE 115 (H) 06/04/2018 1816   BUN 15 06/04/2018 1816   CREATININE 1.11 06/04/2018 1816   CALCIUM 8.7 (L) 06/04/2018 1816   GFRNONAA >60 06/04/2018 1816   GFRAA >60 06/04/2018 1816    Lipid Panel     Component Value Date/Time   CHOL  198 05/24/2018 1602   TRIG 86.0 05/24/2018 1602   HDL 49.70 05/24/2018 1602   CHOLHDL 4 05/24/2018 1602   VLDL 17.2 05/24/2018 1602   LDLCALC 131 (H) 05/24/2018 1602    CBC    Component Value Date/Time   WBC 6.1 06/04/2018 1816   RBC 4.38 (L) 06/04/2018 1816  HGB 13.3 06/04/2018 1816   HCT 39.5 (L) 06/04/2018 1816   PLT 230 06/04/2018 1816   MCV 90.4 06/04/2018 1816   MCH 30.5 06/04/2018 1816   MCHC 33.7 06/04/2018 1816   RDW 16.3 (H) 06/04/2018 1816   LYMPHSABS 2.1 06/04/2018 1816   MONOABS 0.7 06/04/2018 1816   EOSABS 0.1 06/04/2018 1816   BASOSABS 0.0 06/04/2018 1816    Hgb A1C Lab Results  Component Value Date   HGBA1C 6.4 05/24/2018            Assessment & Plan:   Sore Throat, Dysphagia, Constipation:  Gave handout for dysphagia diet to see if that helps Continue Pantoprazole and Ranitidine Follow up with GI as scheduled, may need upper GI Encouraged adequate water intake, high fiber diet Can take Mirilax daily if needed for constipation RX for Magic Mouthwash with Lidocaine for sore throat- no evidence of viral lesions, thrush, strep throat  Return precautions discussed Webb Silversmith, NP

## 2018-06-06 NOTE — Patient Instructions (Signed)
Dysphagia Diet Level 2, Mechanically Altered °The dysphagia level 2 diet includes foods that are blended, chopped, ground, or mashed so they are easier to chew and swallow. The foods are soft, moist, and can be chopped into ¼-inch chunks (such as pancakes, pasta, and bananas). °In order to be on this diet, you must be able to chew. This diet helps you transition between the pureed textures of the dysphagia level 1 diet to more solid textures. This diet is helpful for people with mild to moderate swallowing difficulties. It reduces the risk of food getting caught in the windpipe, trachea, or lungs. °You may need help or supervision during meals while following this diet so that you eat safely. You will be on this diet until your health care provider advances the texture of your diet. °What do I need to know about this diet? °Foods °· You may eat foods that are soft and moist. °? You may need to use a blender, whisk, or masher to soften some of your foods. °? You can moisten foods with gravies, sauces, vegetable or fruit juice, milk, half and half, or water when blending, mashing, or grinding your foods to the right consistency. °· If you were on the dysphagia level 1 diet, you may still eat any of the foods included in that diet. °· Avoid foods that are dry, hard, sticky, chewy, coarse, and crunchy. Also avoid large cuts of food. °· Take small bites. Each bite should contain ¼ inch or less of food. °Liquids °· Avoid liquids with seeds and chunks. °· Thicken liquids, if instructed by your health care provider. Your health care provider will tell you the consistency to which you should thicken your liquids for safe swallowing. To thicken a liquid, use a commercial thickener or a thickening food (such as rice cereal or potato flakes). Ask your health care provider for specific recommendations on thickeners. °See your dietitian or health care provider regularly for help with your dietary changes. °What foods can I  eat? °Grains °Store-bought soft breads that do not have nuts or seeds. Pancakes, sweet rolls, Danish pastries, and French toast that have been moistened with syrup or sauce to form a slurry when blended. Well-cooked pasta, noodles, and bread dressing. Well-cooked noodles and pasta in sauce. Moist macaroni and cheese. Soft dumplings or spaetzle with gravy or butter. Cooked cereals (including oatmeal). Low-texture dry cereals, such as rice puff, corn, or wheat-flake cereals, with milk (if thin liquids are not allowed, make sure all of the milk is absorbed by the cereal before eating it). °Vegetables °Very soft, well-cooked vegetables in pieces less than ½ inch in size. Cooked potatoes that are moist, not crispy, and with sauce. °Fruits °Canned or cooked fruits that are soft or moist and do not have skin or seeds. Fresh, soft bananas. Fruit juices with a small amount of pulp (if thin liquids are allowed). Gelatin or plain gelatin with canned fruit, except pineapple. °Meat and Other Protein Sources °Tender, moist meats, poultry, or fish cooked with gravy or sauce and cubed to ¼-inch bites or smaller. Ground meat. Moist meatball or meatloaf. Fish without bones. Moist casseroles without rice. Tuna, egg, or meat salad without chunks or hard-to-chew vegetables, such as celery and onions. Smooth quiche without large chunks. Scrambled, poached, or soft-cooked eggs with butter, margarine, sauce, or gravy. Tofu. Well-cooked, moistened and mashed beans, peas, baked beans, and other legumes. Casseroles without rice (such as tuna noodle casserole or soft moist meat lasagna). °Dairy °Cream cheese. Yogurt. Cottage   cheese. Ask your health care provider if milk is allowed. °Sweets/Desserts °Pudding. Custard. Soft fruit pies with crust on the bottom only. Crisps and cobblers without seeds or nuts and with soft crusts. Soft, moist cakes. Icing. Pre-gelled cookies. Soft, moist cookies dunked in milk, coffee, or another liquid. Jelly.  Soft, smooth chocolate bars that are easily chewed. Jams and preserves without seeds. Ask your health care provider whether you can have frozen desserts. °Fats and Oils °Butter. Margarine. Cream for cereal, depending on liquid consistency allowed. Gravy. Cream sauces. Mayonnaise. Salad dressings. Cream cheese. Cheese spreads, plain or with soft fruits or vegetables added. Sour cream. Sour cream dips with soft fruits or vegetables added. Whipped toppings. °Other °Sauces and salsas that have soft chunks that are about ½ inch or smaller. °The items listed above may not be a complete list of recommended foods or beverages. Contact your dietitian for more options. °What foods are not recommended? °Grains °All breads not listed in the recommended list. Breads that are hard or have nuts or seeds. Coarse cereals. Cereals that have nuts, seeds, dried fruits, or coconut. Rice. Corn. °Vegetables °Whole, raw, frozen, or dried vegetables. Tough, fibrous, chewy, or stringy cooked vegetables, such as celery, peas, broccoli, cabbage, Brussels sprouts, and asparagus. Potato skins. Potato and other vegetable chips. Fried or French-fried potatoes. Cooked corn and peas. °Fruits °Whole raw, frozen, or dried fruits, including coconut. Pineapple. Fruits with seeds. °Meat and Other Protein Sources °Dry, tough meats, such as bacon, sausage, and hot dogs. Cheese slices and cubes. Peanut butter. Hard boiled or fried eggs. Nuts. Seeds. Pizza. Sandwiches. Dry casseroles or casseroles with rice or large chunks. °Dairy °Yogurt with nuts, seeds, or large chunks. °Sweets/Desserts °Coarse, hard, chewy, or sticky desserts. Any dessert with nuts, seeds, coconut, pineapple, or dried fruit. Ask your health care provider whether you can have frozen desserts. °Fats and Oils °Avoid fats with chunky, large textures, such as those with nuts or fruits. °Other °Soups and casseroles with large chunks. °The items listed above may not be a complete list of foods  and beverages to avoid. Contact your dietitian for more information. °This information is not intended to replace advice given to you by your health care provider. Make sure you discuss any questions you have with your health care provider. °Document Released: 10/18/2005 Document Revised: 03/25/2016 Document Reviewed: 10/01/2013 °Elsevier Interactive Patient Education © 2018 [employer] ° ° ° °

## 2018-06-20 DIAGNOSIS — J309 Allergic rhinitis, unspecified: Secondary | ICD-10-CM | POA: Diagnosis not present

## 2018-06-20 DIAGNOSIS — E1151 Type 2 diabetes mellitus with diabetic peripheral angiopathy without gangrene: Secondary | ICD-10-CM | POA: Diagnosis not present

## 2018-06-20 DIAGNOSIS — Z6831 Body mass index (BMI) 31.0-31.9, adult: Secondary | ICD-10-CM | POA: Diagnosis not present

## 2018-06-20 DIAGNOSIS — R42 Dizziness and giddiness: Secondary | ICD-10-CM | POA: Diagnosis not present

## 2018-06-20 DIAGNOSIS — Z1389 Encounter for screening for other disorder: Secondary | ICD-10-CM | POA: Diagnosis not present

## 2018-06-20 DIAGNOSIS — J329 Chronic sinusitis, unspecified: Secondary | ICD-10-CM | POA: Diagnosis not present

## 2018-06-20 DIAGNOSIS — G4730 Sleep apnea, unspecified: Secondary | ICD-10-CM | POA: Diagnosis not present

## 2018-06-20 DIAGNOSIS — E782 Mixed hyperlipidemia: Secondary | ICD-10-CM | POA: Diagnosis not present

## 2018-06-20 DIAGNOSIS — Z0000 Encounter for general adult medical examination without abnormal findings: Secondary | ICD-10-CM | POA: Diagnosis not present

## 2018-06-20 DIAGNOSIS — E669 Obesity, unspecified: Secondary | ICD-10-CM | POA: Diagnosis not present

## 2018-06-21 ENCOUNTER — Other Ambulatory Visit: Payer: Medicare HMO | Admitting: Urology

## 2018-06-21 ENCOUNTER — Encounter: Payer: Self-pay | Admitting: Urology

## 2018-06-22 DIAGNOSIS — K580 Irritable bowel syndrome with diarrhea: Secondary | ICD-10-CM | POA: Diagnosis not present

## 2018-06-22 DIAGNOSIS — R1031 Right lower quadrant pain: Secondary | ICD-10-CM | POA: Diagnosis not present

## 2018-06-22 DIAGNOSIS — R1032 Left lower quadrant pain: Secondary | ICD-10-CM | POA: Diagnosis not present

## 2018-06-22 DIAGNOSIS — R12 Heartburn: Secondary | ICD-10-CM | POA: Diagnosis not present

## 2018-06-23 DIAGNOSIS — R55 Syncope and collapse: Secondary | ICD-10-CM | POA: Diagnosis not present

## 2018-06-23 DIAGNOSIS — G43909 Migraine, unspecified, not intractable, without status migrainosus: Secondary | ICD-10-CM | POA: Diagnosis not present

## 2018-06-23 DIAGNOSIS — R51 Headache: Secondary | ICD-10-CM | POA: Diagnosis not present

## 2018-06-23 DIAGNOSIS — G44209 Tension-type headache, unspecified, not intractable: Secondary | ICD-10-CM | POA: Diagnosis not present

## 2018-06-28 DIAGNOSIS — D120 Benign neoplasm of cecum: Secondary | ICD-10-CM | POA: Diagnosis not present

## 2018-06-28 DIAGNOSIS — Z5181 Encounter for therapeutic drug level monitoring: Secondary | ICD-10-CM | POA: Diagnosis not present

## 2018-06-28 DIAGNOSIS — K2980 Duodenitis without bleeding: Secondary | ICD-10-CM | POA: Diagnosis not present

## 2018-06-28 DIAGNOSIS — K635 Polyp of colon: Secondary | ICD-10-CM | POA: Diagnosis not present

## 2018-06-28 DIAGNOSIS — K2970 Gastritis, unspecified, without bleeding: Secondary | ICD-10-CM | POA: Diagnosis not present

## 2018-06-28 DIAGNOSIS — K640 First degree hemorrhoids: Secondary | ICD-10-CM | POA: Diagnosis not present

## 2018-06-28 DIAGNOSIS — K319 Disease of stomach and duodenum, unspecified: Secondary | ICD-10-CM | POA: Diagnosis not present

## 2018-06-28 DIAGNOSIS — K2960 Other gastritis without bleeding: Secondary | ICD-10-CM | POA: Diagnosis not present

## 2018-06-28 DIAGNOSIS — K449 Diaphragmatic hernia without obstruction or gangrene: Secondary | ICD-10-CM | POA: Diagnosis not present

## 2018-06-28 DIAGNOSIS — Z1211 Encounter for screening for malignant neoplasm of colon: Secondary | ICD-10-CM | POA: Diagnosis not present

## 2018-06-29 DIAGNOSIS — R569 Unspecified convulsions: Secondary | ICD-10-CM | POA: Diagnosis not present

## 2018-06-29 DIAGNOSIS — R55 Syncope and collapse: Secondary | ICD-10-CM | POA: Diagnosis not present

## 2018-06-30 DIAGNOSIS — R5381 Other malaise: Secondary | ICD-10-CM | POA: Diagnosis not present

## 2018-06-30 DIAGNOSIS — E119 Type 2 diabetes mellitus without complications: Secondary | ICD-10-CM | POA: Diagnosis not present

## 2018-06-30 DIAGNOSIS — R5383 Other fatigue: Secondary | ICD-10-CM | POA: Diagnosis not present

## 2018-06-30 DIAGNOSIS — Z0000 Encounter for general adult medical examination without abnormal findings: Secondary | ICD-10-CM | POA: Diagnosis not present

## 2018-06-30 DIAGNOSIS — D519 Vitamin B12 deficiency anemia, unspecified: Secondary | ICD-10-CM | POA: Diagnosis not present

## 2018-06-30 DIAGNOSIS — K219 Gastro-esophageal reflux disease without esophagitis: Secondary | ICD-10-CM | POA: Diagnosis not present

## 2018-07-14 DIAGNOSIS — K449 Diaphragmatic hernia without obstruction or gangrene: Secondary | ICD-10-CM | POA: Diagnosis not present

## 2018-07-14 DIAGNOSIS — K640 First degree hemorrhoids: Secondary | ICD-10-CM | POA: Diagnosis not present

## 2018-07-14 DIAGNOSIS — K2950 Unspecified chronic gastritis without bleeding: Secondary | ICD-10-CM | POA: Diagnosis not present

## 2018-07-14 DIAGNOSIS — K635 Polyp of colon: Secondary | ICD-10-CM | POA: Diagnosis not present

## 2018-07-17 DIAGNOSIS — N400 Enlarged prostate without lower urinary tract symptoms: Secondary | ICD-10-CM | POA: Diagnosis not present

## 2018-07-17 DIAGNOSIS — K449 Diaphragmatic hernia without obstruction or gangrene: Secondary | ICD-10-CM | POA: Diagnosis not present

## 2018-07-17 DIAGNOSIS — G4730 Sleep apnea, unspecified: Secondary | ICD-10-CM | POA: Diagnosis not present

## 2018-07-17 DIAGNOSIS — E669 Obesity, unspecified: Secondary | ICD-10-CM | POA: Diagnosis not present

## 2018-07-17 DIAGNOSIS — K219 Gastro-esophageal reflux disease without esophagitis: Secondary | ICD-10-CM | POA: Diagnosis not present

## 2018-07-17 DIAGNOSIS — Z23 Encounter for immunization: Secondary | ICD-10-CM | POA: Diagnosis not present

## 2018-07-17 DIAGNOSIS — Z6829 Body mass index (BMI) 29.0-29.9, adult: Secondary | ICD-10-CM | POA: Diagnosis not present

## 2018-07-17 DIAGNOSIS — E782 Mixed hyperlipidemia: Secondary | ICD-10-CM | POA: Diagnosis not present

## 2018-07-26 ENCOUNTER — Telehealth: Payer: Self-pay

## 2018-07-26 NOTE — Telephone Encounter (Signed)
Left message for patient to call back in regards to missed appointment with [facility].Kris Mouton, RMA

## 2018-07-26 NOTE — Telephone Encounter (Signed)
Spoke with patient. Patient went to GI office in [place] instead in August and had colonoscopy and endoscopy done and he will request records from them for Gerlean Ren, RMA

## 2018-07-26 NOTE — Telephone Encounter (Signed)
noted 

## 2018-07-27 ENCOUNTER — Encounter: Payer: Self-pay | Admitting: *Deleted

## 2018-07-27 ENCOUNTER — Emergency Department: Payer: Medicare HMO

## 2018-07-27 ENCOUNTER — Other Ambulatory Visit: Payer: Self-pay

## 2018-07-27 DIAGNOSIS — R1013 Epigastric pain: Secondary | ICD-10-CM | POA: Diagnosis not present

## 2018-07-27 DIAGNOSIS — R079 Chest pain, unspecified: Secondary | ICD-10-CM | POA: Diagnosis not present

## 2018-07-27 DIAGNOSIS — Z79899 Other long term (current) drug therapy: Secondary | ICD-10-CM | POA: Diagnosis not present

## 2018-07-27 DIAGNOSIS — R51 Headache: Secondary | ICD-10-CM | POA: Diagnosis not present

## 2018-07-27 DIAGNOSIS — Z7982 Long term (current) use of aspirin: Secondary | ICD-10-CM | POA: Insufficient documentation

## 2018-07-27 DIAGNOSIS — R42 Dizziness and giddiness: Secondary | ICD-10-CM | POA: Diagnosis not present

## 2018-07-27 LAB — CBC
HCT: 41.6 % (ref 40.0–52.0)
Hemoglobin: 14.1 g/dL (ref 13.0–18.0)
MCH: 30.8 pg (ref 26.0–34.0)
MCHC: 34 g/dL (ref 32.0–36.0)
MCV: 90.5 fL (ref 80.0–100.0)
Platelets: 201 10*3/uL (ref 150–440)
RBC: 4.6 MIL/uL (ref 4.40–5.90)
RDW: 16.2 % — ABNORMAL HIGH (ref 11.5–14.5)
WBC: 5.7 10*3/uL (ref 3.8–10.6)

## 2018-07-27 LAB — BASIC METABOLIC PANEL
Anion gap: 6 (ref 5–15)
BUN: 16 mg/dL (ref 8–23)
CALCIUM: 9 mg/dL (ref 8.9–10.3)
CO2: 26 mmol/L (ref 22–32)
Chloride: 109 mmol/L (ref 98–111)
Creatinine, Ser: 1.4 mg/dL — ABNORMAL HIGH (ref 0.61–1.24)
GFR calc Af Amer: 57 mL/min — ABNORMAL LOW (ref >60)
GFR, EST NON AFRICAN AMERICAN: 49 mL/min — AB (ref >60)
GLUCOSE: 112 mg/dL — AB (ref 70–99)
Potassium: 3.6 mmol/L (ref 3.5–5.1)
Sodium: 141 mmol/L (ref 135–145)

## 2018-07-27 LAB — TROPONIN I

## 2018-07-27 NOTE — ED Notes (Signed)
No head ct scan at this time per dr Cinda Quest

## 2018-07-27 NOTE — ED Triage Notes (Signed)
Pt reports waking up from a nap with right side jaw pain and chest pain at 1700 today.  Pt also reports a headache with pain radiating into right arm.  No n/v/d   Pt reports dizziness. Pt alert  Speech clear.

## 2018-07-28 ENCOUNTER — Emergency Department: Payer: Medicare HMO

## 2018-07-28 ENCOUNTER — Emergency Department
Admission: EM | Admit: 2018-07-28 | Discharge: 2018-07-28 | Disposition: A | Discharge status: Home / Self Care | Payer: Medicare HMO | Attending: Emergency Medicine | Admitting: Emergency Medicine

## 2018-07-28 DIAGNOSIS — R519 Headache, unspecified: Secondary | ICD-10-CM

## 2018-07-28 DIAGNOSIS — Z7982 Long term (current) use of aspirin: Secondary | ICD-10-CM | POA: Diagnosis not present

## 2018-07-28 DIAGNOSIS — R51 Headache: Secondary | ICD-10-CM | POA: Diagnosis not present

## 2018-07-28 DIAGNOSIS — Z79899 Other long term (current) drug therapy: Secondary | ICD-10-CM | POA: Diagnosis not present

## 2018-07-28 DIAGNOSIS — R42 Dizziness and giddiness: Secondary | ICD-10-CM | POA: Diagnosis not present

## 2018-07-28 MED ORDER — PANTOPRAZOLE SODIUM 40 MG PO TBEC
40.0000 mg | DELAYED_RELEASE_TABLET | Freq: Once | ORAL | Status: AC
  Administered 2018-07-28: 40 mg via ORAL
  Filled 2018-07-28: qty 1

## 2018-07-28 MED ORDER — ASPIRIN 81 MG PO CHEW
324.0000 mg | CHEWABLE_TABLET | Freq: Once | ORAL | Status: AC
  Administered 2018-07-28: 324 mg via ORAL
  Filled 2018-07-28: qty 4

## 2018-07-28 NOTE — ED Notes (Signed)
NAD noted at time of D/C. Pt denies questions or concerns. Pt ambulatory to the lobby at this time with this RN.

## 2018-07-28 NOTE — ED Provider Notes (Signed)
[facility] Emergency Department Provider Note    First MD Initiated Contact with Patient 07/28/18 [phone]     (approximate)  I have reviewed the triage vital signs and the nursing notes.   HISTORY  Chief Complaint Jaw Pain and Chest Pain    HPI Timothy Alvarado is a 72 y.o. male with below list of chronic medical conditions presents to the emergency department with headache and jaw pain which patient states has been occurring for a while".  However patient states that this episode began at 5 PM today.  Patient denies any weakness numbness gait instability or visual changes.  Patient also admits to "reflux".  Patient states that he has burning epigastric discomfort consistent with previous episodes of reflux for which he takes Protonix.  Patient denies any chest pain  Past Medical History:  Diagnosis Date  . Allergy   . GERD (gastroesophageal reflux disease)   . Glaucoma   . Hyperlipidemia     Patient Active Problem List   Diagnosis Date Noted  . Elevated PSA 01/26/2018  . Benign prostatic hyperplasia with lower urinary tract symptoms 01/26/2018  . Seasonal allergies 05/10/2017  . GERD (gastroesophageal reflux disease) 05/10/2017  . HLD (hyperlipidemia) 05/10/2017  . Glaucoma 05/10/2017  . Borderline diabetes 05/10/2017  . Neuropathy 05/10/2017    Past Surgical History:  Procedure Laterality Date  . BACK SURGERY     lumbar   . ROTATOR CUFF REPAIR Left     Prior to Admission medications   Medication Sig Start Date End Date Taking? Authorizing Provider  acetaminophen (TYLENOL) 500 MG tablet Take 1 tablet (500 mg total) by mouth every 6 (six) hours as needed. 10/24/17   Shary Decamp, PA-C  aspirin 81 MG tablet Take 81 mg by mouth daily as needed for pain.     [provider]  aspirin-acetaminophen-caffeine (EXCEDRIN MIGRAINE) [phone] MG tablet Take 2 tablets by mouth every 6 (six) hours as needed for headache.    [provider]    bimatoprost (LUMIGAN) 0.01 % SOLN Place 1 drop into both eyes at bedtime.    [provider]  fluticasone (FLONASE) 50 MCG/ACT nasal spray Place 2 sprays into both nostrils daily. 01/20/18   Ria Bush, MD  latanoprost (XALATAN) 0.005 % ophthalmic solution INSTILL 1 DROP INTO AFFECTED EYE EVERY DAY AS DIRECTED 03/28/18   [provider]  magic mouthwash w/lidocaine SOLN Take 5 mLs by mouth 4 (four) times daily as needed for mouth pain. 06/06/18   Jearld Fenton, NP  naproxen (NAPROSYN) 500 MG tablet Take 500 mg by mouth 2 (two) times daily with a meal.    [provider]  pantoprazole (PROTONIX) 40 MG tablet Take 1 tablet (40 mg total) by mouth daily. 05/24/18   Jearld Fenton, NP  pregabalin (LYRICA) 100 MG capsule Take 1 capsule (100 mg total) by mouth 2 (two) times daily. 05/24/18   Jearld Fenton, NP  ranitidine (ZANTAC) 150 MG tablet Take 150 mg by mouth daily as needed for heartburn.    [provider]  ranitidine (ZANTAC) 150 MG tablet Take 1 tablet (150 mg total) by mouth 2 (two) times daily. 06/04/18 06/04/19  Merlyn Lot, MD  rosuvastatin (CRESTOR) 40 MG tablet Take 1 tablet (40 mg total) by mouth daily. MUST SCHEDULE LAB VISIT 06/01/18   Jearld Fenton, NP  tamsulosin (FLOMAX) 0.4 MG CAPS capsule Take 1 capsule (0.4 mg total) by mouth daily. 03/10/18   Stoioff, Ronda Fairly, MD  tiZANidine (ZANAFLEX) 4 MG tablet Take 1 tablet (4 mg total) by mouth 3 (three) times daily as needed for muscle spasms. 05/24/18   Jearld Fenton, NP  vitamin B-12 (CYANOCOBALAMIN) 100 MCG tablet Take 100 mcg by mouth daily.    [provider]    Allergies Percocet [oxycodone-acetaminophen]  Family History  Problem Relation Age of Onset  . Breast cancer Sister   . Prostate cancer Brother   . Hyperlipidemia Brother     Social History Social History   Tobacco Use  . Smoking status: Never Smoker  . Smokeless tobacco: Never Used  Substance Use Topics  .  Alcohol use: Not Currently    Comment: occasional beer  . Drug use: No    Review of Systems Constitutional: No fever/chills Eyes: No visual changes. ENT: No sore throat. Cardiovascular: Denies chest pain. Respiratory: Denies shortness of breath. Gastrointestinal: No abdominal pain.  No nausea, no vomiting.  No diarrhea.  No constipation. Genitourinary: Negative for dysuria. Musculoskeletal: Negative for neck pain.  Negative for back pain. Integumentary: Negative for rash. Neurological: Positive for headache negative for focal weakness or numbness.   ____________________________________________   PHYSICAL EXAM:  VITAL SIGNS: ED Triage Vitals  Enc Vitals Group     BP 07/27/18 1932 115/62     Pulse Rate 07/27/18 1932 64     Resp 07/27/18 1932 20     Temp 07/27/18 1932 98.4 F (36.9 C)     Temp Source 07/27/18 1932 Oral     SpO2 07/27/18 1932 95 %     Weight 07/27/18 1933 108.4 kg (239 lb)     Height 07/27/18 1933 1.88 m (6\' 2" )     Head Circumference --      Peak Flow --      Pain Score 07/27/18 1933 7     Pain Loc --      Pain Edu? --      Excl. in [place]? --     Constitutional: Alert and oriented. Well appearing and in no acute distress. Eyes: Conjunctivae are normal. PERRL. EOMI. Head: Atraumatic. Mouth/Throat: Mucous membranes are moist.  Oropharynx non-erythematous. Neck: No stridor.   Cardiovascular: Normal rate, regular rhythm. Good peripheral circulation. Grossly normal heart sounds. Respiratory: Normal respiratory effort.  No retractions. Lungs CTAB. Gastrointestinal: Soft and nontender. No distention.  Musculoskeletal: No lower extremity tenderness nor edema. No gross deformities of extremities. Neurologic:  Normal speech and language. No gross focal neurologic deficits are appreciated.  Skin:  Skin is warm, dry and intact. No rash noted. Psychiatric: Mood and affect are normal. Speech and behavior are normal.  ____________________________________________     LABS (all labs ordered are listed, but only abnormal results are displayed)  Labs Reviewed  BASIC METABOLIC PANEL - Abnormal; Notable for the following components:      Result Value   Glucose, Bld 112 (*)    Creatinine, Ser 1.40 (*)    GFR calc non Af Amer 49 (*)    GFR calc Af Amer 57 (*)    All other components within normal limits  CBC - Abnormal; Notable for the following components:   RDW 16.2 (*)    All other components within normal limits  TROPONIN I  TROPONIN I   ____________________________________________  EKG  ED ECG REPORT I,  N BROWN, the attending physician, personally viewed and interpreted this ECG.   Date: 07/28/2018  EKG Time: 7:39 PM  Rate: 64  Rhythm: Normal sinus rhythm  Axis: Normal  Intervals: Normal  ST&T Change: None  ____________________________________________  RADIOLOGY  I, [place] N BROWN, personally viewed and evaluated these images (plain radiographs) as part of my medical decision making, as well as reviewing the written report by the radiologist. ED MD interpretation: No active cardiopulmonary disease noted on chest x-ray per radiologist.  No acute intracranial abnormality noted on CT head.  Official radiology report(s): Dg Chest 2 View  Result Date: 07/27/2018 CLINICAL DATA:  Chest pain EXAM: CHEST - 2 VIEW COMPARISON:  06/04/2018 FINDINGS: Heart and mediastinal contours are within normal limits. No focal opacities or effusions. No acute bony abnormality. IMPRESSION: No active cardiopulmonary disease. Electronically Signed   By: Rolm Baptise M.D.   On: 07/27/2018 19:51   Ct Head Wo Contrast  Result Date: 07/28/2018 CLINICAL DATA:  Initial evaluation for acute headache with radiation into right arm, dizziness. EXAM: CT HEAD WITHOUT CONTRAST TECHNIQUE: Contiguous axial images were obtained from the base of the skull through the vertex without intravenous contrast. COMPARISON:  Prior MRI from 05/29/2018. FINDINGS: Brain: Atrophy  with advanced chronic microvascular ischemic disease, similar to previous. No acute intracranial hemorrhage. No acute large vessel territory infarct. No mass lesion, midline shift or mass effect. No hydrocephalus. No extra-axial fluid collection. Vascular: No hyperdense vessel. Scattered vascular calcifications noted within the carotid siphons. Skull: Scalp soft tissues and calvarium within normal limits. Sinuses/Orbits: Globes normal soft tissues unremarkable. Other: Visualized paranasal sinuses and mastoid air cells are clear. IMPRESSION: 1. No acute intracranial abnormality. 2. Advanced cerebral white matter disease, most likely related chronic microvascular ischemic changes, similar to previous. Electronically Signed   By: Jeannine Boga M.D.   On: 07/28/2018 04:36      Procedures   ____________________________________________   INITIAL IMPRESSION / ASSESSMENT AND PLAN / ED COURSE  As part of my medical decision making, I reviewed the following data within the electronic MEDICAL RECORD NUMBER   72 year old male presenting with above-stated history and physical exam secondary to headache and burning epigastric abdominal pain.  Patient given Protonix and Tylenol with headache resolution as well as resolution of the patient's burning epigastric pain EKG laboratory data all unremarkable in addition to all radiographic studies. ____________________________________________  FINAL CLINICAL IMPRESSION(S) / ED DIAGNOSES  Final diagnoses:  Nonintractable headache, unspecified chronicity pattern, unspecified headache type     MEDICATIONS GIVEN DURING THIS VISIT:  Medications  pantoprazole (PROTONIX) EC tablet 40 mg (40 mg Oral Given 07/28/18 0303)  aspirin chewable tablet 324 mg (324 mg Oral Given 07/28/18 0303)     ED Discharge Orders    None       Note:  This document was prepared using Dragon voice recognition software and may include unintentional dictation errors.    Gregor Hams, MD 07/28/18 [phone]

## 2018-08-01 ENCOUNTER — Encounter (HOSPITAL_COMMUNITY): Payer: Self-pay | Admitting: Emergency Medicine

## 2018-08-01 ENCOUNTER — Emergency Department (HOSPITAL_COMMUNITY)
Admission: EM | Admit: 2018-08-01 | Discharge: 2018-08-01 | Disposition: A | Discharge status: Home / Self Care | Payer: Medicare HMO | Attending: Emergency Medicine | Admitting: Emergency Medicine

## 2018-08-01 ENCOUNTER — Other Ambulatory Visit: Payer: Self-pay

## 2018-08-01 DIAGNOSIS — Z79899 Other long term (current) drug therapy: Secondary | ICD-10-CM | POA: Diagnosis not present

## 2018-08-01 DIAGNOSIS — J029 Acute pharyngitis, unspecified: Secondary | ICD-10-CM

## 2018-08-01 DIAGNOSIS — T17920A Food in respiratory tract, part unspecified causing asphyxiation, initial encounter: Secondary | ICD-10-CM | POA: Diagnosis not present

## 2018-08-01 DIAGNOSIS — R0789 Other chest pain: Secondary | ICD-10-CM | POA: Diagnosis not present

## 2018-08-01 DIAGNOSIS — R070 Pain in throat: Secondary | ICD-10-CM | POA: Insufficient documentation

## 2018-08-01 DIAGNOSIS — R079 Chest pain, unspecified: Secondary | ICD-10-CM | POA: Diagnosis not present

## 2018-08-01 DIAGNOSIS — R05 Cough: Secondary | ICD-10-CM | POA: Diagnosis not present

## 2018-08-01 DIAGNOSIS — Z7982 Long term (current) use of aspirin: Secondary | ICD-10-CM | POA: Diagnosis not present

## 2018-08-01 DIAGNOSIS — R1312 Dysphagia, oropharyngeal phase: Secondary | ICD-10-CM | POA: Insufficient documentation

## 2018-08-01 MED ORDER — GI COCKTAIL ~~LOC~~
30.0000 mL | Freq: Once | ORAL | Status: AC
  Administered 2018-08-01: 30 mL via ORAL
  Filled 2018-08-01: qty 30

## 2018-08-01 MED ORDER — MAGIC MOUTHWASH W/LIDOCAINE: 5.0000 mL | Freq: Four times a day (QID) | ORAL | 0 refills | Status: AC | PRN

## 2018-08-01 NOTE — ED Triage Notes (Signed)
Pt also c/o sore throat related to coughing/ choking 1 hour ago.

## 2018-08-01 NOTE — ED Provider Notes (Signed)
[facility] EMERGENCY DEPARTMENT Provider Note   CSN: [id number] Arrival date & time: 08/01/18  1833     History   Chief Complaint Chief Complaint  Patient presents with  . Trouble Swallowing  . Sore Throat    HPI Timothy Alvarado is a 72 y.o. male.  Patient with history of high cholesterol, hiatal hernia, gastritis who presents to the ED with difficulty swallowing, sore throat.  Patient states that he was eating today and choked up on his food and has had some sore throat afterwards.  He was recently diagnosed with a hiatal hernia and gastritis.  He has had reflux symptoms for a long time and has had difficulty swallowing certain foods at times.  He denies any strokelike symptoms.  No history of stroke.  Patient overall feels well but has a little bit of sore throat.  He has been on Magic mouthwash in the past that has helped.  He has follow-up with GI this week.  Patient states that sometimes food feels like it gets stuck in the middle of his chest.  Denies any chest pain, shortness of breath, abdominal pain  The history is provided by the patient.  Sore Throat  This is a new problem. The current episode started 6 to 12 hours ago. The problem occurs every several days. The problem has been resolved. Pertinent negatives include no chest pain, no abdominal pain, no headaches and no shortness of breath. The symptoms are aggravated by eating. Nothing relieves the symptoms. He has tried nothing for the symptoms. The treatment provided no relief.    Past Medical History:  Diagnosis Date  . Allergy   . GERD (gastroesophageal reflux disease)   . Glaucoma   . Hyperlipidemia     Patient Active Problem List   Diagnosis Date Noted  . Elevated PSA 01/26/2018  . Benign prostatic hyperplasia with lower urinary tract symptoms 01/26/2018  . Seasonal allergies 05/10/2017  . GERD (gastroesophageal reflux disease) 05/10/2017  . HLD (hyperlipidemia) 05/10/2017  . Glaucoma  05/10/2017  . Borderline diabetes 05/10/2017  . Neuropathy 05/10/2017    Past Surgical History:  Procedure Laterality Date  . BACK SURGERY     lumbar   . ROTATOR CUFF REPAIR Left         Home Medications    Prior to Admission medications   Medication Sig Start Date End Date Taking? Authorizing Provider  acetaminophen (TYLENOL) 500 MG tablet Take 1 tablet (500 mg total) by mouth every 6 (six) hours as needed. 10/24/17   Shary Decamp, PA-C  aspirin 81 MG tablet Take 81 mg by mouth daily as needed for pain.     [provider]  aspirin-acetaminophen-caffeine (EXCEDRIN MIGRAINE) [phone] MG tablet Take 2 tablets by mouth every 6 (six) hours as needed for headache.    [provider]  bimatoprost (LUMIGAN) 0.01 % SOLN Place 1 drop into both eyes at bedtime.    [provider]  fluticasone (FLONASE) 50 MCG/ACT nasal spray Place 2 sprays into both nostrils daily. 01/20/18   Ria Bush, MD  latanoprost (XALATAN) 0.005 % ophthalmic solution INSTILL 1 DROP INTO AFFECTED EYE EVERY DAY AS DIRECTED 03/28/18   [provider]  magic mouthwash w/lidocaine SOLN Take 5 mLs by mouth 4 (four) times daily as needed for mouth pain. 08/01/18   Angenette Daily, DO  naproxen (NAPROSYN) 500 MG tablet Take 500 mg by mouth 2 (two) times daily with a meal.    [provider]  pantoprazole (  PROTONIX) 40 MG tablet Take 1 tablet (40 mg total) by mouth daily. 05/24/18   Jearld Fenton, NP  pregabalin (LYRICA) 100 MG capsule Take 1 capsule (100 mg total) by mouth 2 (two) times daily. 05/24/18   Jearld Fenton, NP  ranitidine (ZANTAC) 150 MG tablet Take 150 mg by mouth daily as needed for heartburn.    [provider]  ranitidine (ZANTAC) 150 MG tablet Take 1 tablet (150 mg total) by mouth 2 (two) times daily. 06/04/18 06/04/19  Merlyn Lot, MD  rosuvastatin (CRESTOR) 40 MG tablet Take 1 tablet (40 mg total) by mouth daily. MUST SCHEDULE LAB VISIT 06/01/18    Jearld Fenton, NP  tamsulosin (FLOMAX) 0.4 MG CAPS capsule Take 1 capsule (0.4 mg total) by mouth daily. 03/10/18   Stoioff, Ronda Fairly, MD  tiZANidine (ZANAFLEX) 4 MG tablet Take 1 tablet (4 mg total) by mouth 3 (three) times daily as needed for muscle spasms. 05/24/18   Jearld Fenton, NP  vitamin B-12 (CYANOCOBALAMIN) 100 MCG tablet Take 100 mcg by mouth daily.    [provider]    Family History Family History  Problem Relation Age of Onset  . Breast cancer Sister   . Prostate cancer Brother   . Hyperlipidemia Brother     Social History Social History   Tobacco Use  . Smoking status: Never Smoker  . Smokeless tobacco: Never Used  Substance Use Topics  . Alcohol use: Not Currently    Comment: occasional beer  . Drug use: No     Allergies   Percocet [oxycodone-acetaminophen]   Review of Systems Review of Systems  Constitutional: Negative for chills and fever.  HENT: Positive for trouble swallowing. Negative for congestion, dental problem, drooling, ear discharge, ear pain, facial swelling, postnasal drip and sore throat.   Eyes: Negative for pain and visual disturbance.  Respiratory: Negative for cough and shortness of breath.   Cardiovascular: Negative for chest pain and palpitations.  Gastrointestinal: Negative for abdominal pain and vomiting.  Genitourinary: Negative for dysuria and hematuria.  Musculoskeletal: Negative for arthralgias and back pain.  Skin: Negative for color change and rash.  Neurological: Negative for seizures, syncope and headaches.  All other systems reviewed and are negative.    Physical Exam Updated Vital Signs BP 128/87   Pulse 64   Temp 97.6 F (36.4 C) (Oral)   Resp 16   Ht 6\' 1"  (1.854 m)   Wt 108.4 kg   SpO2 94%   BMI 31.53 kg/m   Physical Exam  Constitutional: He appears well-developed and well-nourished.  HENT:  Head: Normocephalic and atraumatic.  Mouth/Throat: Uvula is midline, oropharynx is clear and moist  and mucous membranes are normal. No oral lesions. No uvula swelling. No oropharyngeal exudate, posterior oropharyngeal edema, posterior oropharyngeal erythema or tonsillar abscesses.  Eyes: Pupils are equal, round, and reactive to light. Conjunctivae and EOM are normal.  Neck: Normal range of motion. Neck supple.  Cardiovascular: Normal rate and regular rhythm.  No murmur heard. Pulmonary/Chest: Effort normal and breath sounds normal. No respiratory distress.  Abdominal: Soft. Bowel sounds are normal. There is no tenderness.  Musculoskeletal: He exhibits no edema.  Lymphadenopathy:    He has no cervical adenopathy.  Neurological: He is alert.  Skin: Skin is warm and dry. Capillary refill takes less than 2 seconds.  Psychiatric: He has a normal mood and affect.  Nursing note and vitals reviewed.    ED Treatments / Results  Labs (all labs  ordered are listed, but only abnormal results are displayed) Labs Reviewed - No data to display  EKG None  Radiology No results found.  Procedures Procedures (including critical care time)  Medications Ordered in ED Medications  gi cocktail (Maalox,Lidocaine,Donnatal) (30 mLs Oral Given 08/01/18 2145)     Initial Impression / Assessment and Plan / ED Course  I have reviewed the triage vital signs and the nursing notes.  Pertinent labs & imaging results that were available during my care of the patient were reviewed by me and considered in my medical decision making (see chart for details).     Timothy Alvarado is a 72 year old male with history of high cholesterol, hiatal hernia, gastritis who presents to the ED with sore throat after having difficulty swallowing today.  Patient with unremarkable vitals.  No fever.  Patient recently diagnosed with a hiatal hernia and gastritis.  Patient states that he had some difficulty eating some food today and has had sore throat since.  Denies any chest pain, shortness of breath, abdominal pain.  He is  overall asymptomatic at this time.  He states that he has had Magic mouthwash in the past that has helped.  He had a recent EGD and colonoscopy and follows up with GI this week to discuss his hiatal hernia.  He states that he has had worsening symptoms with certain foods.  Educated him about hiatal hernias and certain foods to avoid.  He is on reflux medications.  Exam is overall unremarkable.  There is no issues in the oropharynx.  Patient given GI cocktail with improvement here.  Patient given prescription for Magic mouthwash.  May benefit from a swallowing study however suspect that symptoms are likely secondary to hiatal hernia.  Discharged from ED in good condition. Told return to the ED if symptoms worsen.  This chart was dictated using voice recognition software.  Despite best efforts to proofread,  errors can occur which can change the documentation meaning.   Final Clinical Impressions(s) / ED Diagnoses   Final diagnoses:  Sore throat    ED Discharge Orders         Ordered    magic mouthwash w/lidocaine SOLN  4 times daily PRN    Note to Pharmacy:  Viscous Lidocaine 2% 80 mL Benadryl 12.5 mg/5 mL 80 mL Maalox 80 mL   08/01/18 2137           Lennice Sites, DO 08/01/18 2209

## 2018-08-01 NOTE — ED Notes (Signed)
Patient verbalizes understanding of discharge instructions. Opportunity for questioning and answers were provided. Armband removed by staff, pt discharged from ED.  

## 2018-08-01 NOTE — ED Triage Notes (Signed)
Pt arrived [facility] for c/o choking on applesauce earlier today, per EMS pt has a hx of choking and had a scope on 8/28 to investigate and found a hiatal hernia. Pt in NAD Ems Vitals BP 122/82 P 88 O2 99 RR16

## 2018-08-03 DIAGNOSIS — R001 Bradycardia, unspecified: Secondary | ICD-10-CM | POA: Diagnosis not present

## 2018-08-03 DIAGNOSIS — R079 Chest pain, unspecified: Secondary | ICD-10-CM | POA: Diagnosis not present

## 2018-08-03 DIAGNOSIS — R0789 Other chest pain: Secondary | ICD-10-CM | POA: Diagnosis not present

## 2018-08-03 DIAGNOSIS — K449 Diaphragmatic hernia without obstruction or gangrene: Secondary | ICD-10-CM | POA: Diagnosis not present

## 2018-08-03 DIAGNOSIS — Z5181 Encounter for therapeutic drug level monitoring: Secondary | ICD-10-CM | POA: Diagnosis not present

## 2018-08-03 DIAGNOSIS — Z79899 Other long term (current) drug therapy: Secondary | ICD-10-CM | POA: Diagnosis not present

## 2018-08-04 DIAGNOSIS — K2980 Duodenitis without bleeding: Secondary | ICD-10-CM | POA: Diagnosis not present

## 2018-08-04 DIAGNOSIS — K449 Diaphragmatic hernia without obstruction or gangrene: Secondary | ICD-10-CM | POA: Diagnosis not present

## 2018-08-04 DIAGNOSIS — K2950 Unspecified chronic gastritis without bleeding: Secondary | ICD-10-CM | POA: Diagnosis not present

## 2018-08-07 DIAGNOSIS — Z136 Encounter for screening for cardiovascular disorders: Secondary | ICD-10-CM | POA: Diagnosis not present

## 2018-08-07 DIAGNOSIS — K219 Gastro-esophageal reflux disease without esophagitis: Secondary | ICD-10-CM | POA: Diagnosis not present

## 2018-08-07 DIAGNOSIS — R072 Precordial pain: Secondary | ICD-10-CM | POA: Diagnosis not present

## 2018-08-07 DIAGNOSIS — E6609 Other obesity due to excess calories: Secondary | ICD-10-CM | POA: Diagnosis not present

## 2018-08-07 DIAGNOSIS — N400 Enlarged prostate without lower urinary tract symptoms: Secondary | ICD-10-CM | POA: Diagnosis not present

## 2018-08-07 DIAGNOSIS — I10 Essential (primary) hypertension: Secondary | ICD-10-CM | POA: Diagnosis not present

## 2018-08-07 DIAGNOSIS — E782 Mixed hyperlipidemia: Secondary | ICD-10-CM | POA: Diagnosis not present

## 2018-08-08 DIAGNOSIS — R55 Syncope and collapse: Secondary | ICD-10-CM | POA: Diagnosis not present

## 2018-08-08 DIAGNOSIS — G44209 Tension-type headache, unspecified, not intractable: Secondary | ICD-10-CM | POA: Diagnosis not present

## 2018-08-08 DIAGNOSIS — G43909 Migraine, unspecified, not intractable, without status migrainosus: Secondary | ICD-10-CM | POA: Diagnosis not present

## 2018-08-10 DIAGNOSIS — R972 Elevated prostate specific antigen [PSA]: Secondary | ICD-10-CM | POA: Diagnosis not present

## 2018-08-10 DIAGNOSIS — N400 Enlarged prostate without lower urinary tract symptoms: Secondary | ICD-10-CM | POA: Diagnosis not present

## 2018-08-10 DIAGNOSIS — Z6831 Body mass index (BMI) 31.0-31.9, adult: Secondary | ICD-10-CM | POA: Diagnosis not present

## 2018-08-12 DIAGNOSIS — J45909 Unspecified asthma, uncomplicated: Secondary | ICD-10-CM | POA: Diagnosis not present

## 2018-08-12 DIAGNOSIS — K219 Gastro-esophageal reflux disease without esophagitis: Secondary | ICD-10-CM | POA: Diagnosis not present

## 2018-08-12 DIAGNOSIS — E7800 Pure hypercholesterolemia, unspecified: Secondary | ICD-10-CM | POA: Diagnosis not present

## 2018-08-12 DIAGNOSIS — R1013 Epigastric pain: Secondary | ICD-10-CM | POA: Diagnosis not present

## 2018-08-12 DIAGNOSIS — Z8719 Personal history of other diseases of the digestive system: Secondary | ICD-10-CM | POA: Diagnosis not present

## 2018-08-12 DIAGNOSIS — R070 Pain in throat: Secondary | ICD-10-CM | POA: Diagnosis not present

## 2018-08-12 DIAGNOSIS — R002 Palpitations: Secondary | ICD-10-CM | POA: Diagnosis not present

## 2018-08-12 DIAGNOSIS — I2510 Atherosclerotic heart disease of native coronary artery without angina pectoris: Secondary | ICD-10-CM | POA: Diagnosis not present

## 2018-08-12 DIAGNOSIS — R69 Illness, unspecified: Secondary | ICD-10-CM | POA: Diagnosis not present

## 2018-08-16 DIAGNOSIS — K2980 Duodenitis without bleeding: Secondary | ICD-10-CM | POA: Diagnosis not present

## 2018-08-16 DIAGNOSIS — K449 Diaphragmatic hernia without obstruction or gangrene: Secondary | ICD-10-CM | POA: Diagnosis not present

## 2018-08-16 DIAGNOSIS — K640 First degree hemorrhoids: Secondary | ICD-10-CM | POA: Diagnosis not present

## 2018-08-16 DIAGNOSIS — K2950 Unspecified chronic gastritis without bleeding: Secondary | ICD-10-CM | POA: Diagnosis not present

## 2018-08-18 ENCOUNTER — Telehealth: Payer: Self-pay | Admitting: Urology

## 2018-08-18 NOTE — Telephone Encounter (Signed)
error 

## 2018-08-18 NOTE — Telephone Encounter (Signed)
Patient was seen in June for an elevated PSA and had an abnormal CT.  He was a no-show for his August 2019 appointment.  Recommend scheduling follow-up for repeat PSA and discussion of his CT findings.

## 2018-08-21 DIAGNOSIS — I2510 Atherosclerotic heart disease of native coronary artery without angina pectoris: Secondary | ICD-10-CM | POA: Diagnosis not present

## 2018-08-21 DIAGNOSIS — R072 Precordial pain: Secondary | ICD-10-CM | POA: Diagnosis not present

## 2018-08-22 DIAGNOSIS — G909 Disorder of the autonomic nervous system, unspecified: Secondary | ICD-10-CM | POA: Diagnosis not present

## 2018-08-22 DIAGNOSIS — R55 Syncope and collapse: Secondary | ICD-10-CM | POA: Diagnosis not present

## 2018-08-23 ENCOUNTER — Encounter: Payer: Self-pay | Admitting: Urology

## 2018-08-23 NOTE — Telephone Encounter (Signed)
Letter mailed to the patient to contact the office to reschedule his missed app   Timothy Alvarado

## 2018-08-24 DIAGNOSIS — J310 Chronic rhinitis: Secondary | ICD-10-CM | POA: Diagnosis not present

## 2018-08-24 DIAGNOSIS — K449 Diaphragmatic hernia without obstruction or gangrene: Secondary | ICD-10-CM | POA: Diagnosis not present

## 2018-08-24 DIAGNOSIS — R634 Abnormal weight loss: Secondary | ICD-10-CM | POA: Diagnosis not present

## 2018-08-24 DIAGNOSIS — R109 Unspecified abdominal pain: Secondary | ICD-10-CM | POA: Diagnosis not present

## 2018-08-24 DIAGNOSIS — E782 Mixed hyperlipidemia: Secondary | ICD-10-CM | POA: Diagnosis not present

## 2018-08-24 DIAGNOSIS — Z6827 Body mass index (BMI) 27.0-27.9, adult: Secondary | ICD-10-CM | POA: Diagnosis not present

## 2018-08-24 DIAGNOSIS — R51 Headache: Secondary | ICD-10-CM | POA: Diagnosis not present

## 2018-08-24 DIAGNOSIS — N400 Enlarged prostate without lower urinary tract symptoms: Secondary | ICD-10-CM | POA: Diagnosis not present

## 2018-08-24 DIAGNOSIS — K219 Gastro-esophageal reflux disease without esophagitis: Secondary | ICD-10-CM | POA: Diagnosis not present

## 2018-08-31 DIAGNOSIS — R140 Abdominal distension (gaseous): Secondary | ICD-10-CM | POA: Diagnosis not present

## 2018-08-31 DIAGNOSIS — R1312 Dysphagia, oropharyngeal phase: Secondary | ICD-10-CM | POA: Diagnosis not present

## 2018-08-31 DIAGNOSIS — K219 Gastro-esophageal reflux disease without esophagitis: Secondary | ICD-10-CM | POA: Diagnosis not present

## 2018-08-31 DIAGNOSIS — R1013 Epigastric pain: Secondary | ICD-10-CM | POA: Diagnosis not present

## 2018-09-01 DIAGNOSIS — E782 Mixed hyperlipidemia: Secondary | ICD-10-CM | POA: Diagnosis not present

## 2018-09-01 DIAGNOSIS — R1084 Generalized abdominal pain: Secondary | ICD-10-CM | POA: Diagnosis not present

## 2018-09-01 DIAGNOSIS — R634 Abnormal weight loss: Secondary | ICD-10-CM | POA: Diagnosis not present

## 2018-09-03 DIAGNOSIS — E7800 Pure hypercholesterolemia, unspecified: Secondary | ICD-10-CM | POA: Diagnosis not present

## 2018-09-03 DIAGNOSIS — J45909 Unspecified asthma, uncomplicated: Secondary | ICD-10-CM | POA: Diagnosis not present

## 2018-09-03 DIAGNOSIS — R072 Precordial pain: Secondary | ICD-10-CM | POA: Diagnosis not present

## 2018-09-03 DIAGNOSIS — R42 Dizziness and giddiness: Secondary | ICD-10-CM | POA: Diagnosis not present

## 2018-09-03 DIAGNOSIS — R531 Weakness: Secondary | ICD-10-CM | POA: Diagnosis not present

## 2018-09-03 DIAGNOSIS — R51 Headache: Secondary | ICD-10-CM | POA: Diagnosis not present

## 2018-09-03 DIAGNOSIS — R1084 Generalized abdominal pain: Secondary | ICD-10-CM | POA: Diagnosis not present

## 2018-09-03 DIAGNOSIS — R0602 Shortness of breath: Secondary | ICD-10-CM | POA: Diagnosis not present

## 2018-09-03 DIAGNOSIS — Z743 Need for continuous supervision: Secondary | ICD-10-CM | POA: Diagnosis not present

## 2018-09-03 DIAGNOSIS — R9082 White matter disease, unspecified: Secondary | ICD-10-CM | POA: Diagnosis not present

## 2018-09-03 DIAGNOSIS — R0789 Other chest pain: Secondary | ICD-10-CM | POA: Diagnosis not present

## 2018-09-03 DIAGNOSIS — R109 Unspecified abdominal pain: Secondary | ICD-10-CM | POA: Diagnosis not present

## 2018-09-03 DIAGNOSIS — R079 Chest pain, unspecified: Secondary | ICD-10-CM | POA: Diagnosis not present

## 2018-09-03 DIAGNOSIS — R55 Syncope and collapse: Secondary | ICD-10-CM | POA: Diagnosis not present

## 2018-09-03 DIAGNOSIS — I10 Essential (primary) hypertension: Secondary | ICD-10-CM | POA: Diagnosis not present

## 2018-09-04 DIAGNOSIS — I484 Atypical atrial flutter: Secondary | ICD-10-CM | POA: Diagnosis not present

## 2018-09-04 DIAGNOSIS — E860 Dehydration: Secondary | ICD-10-CM | POA: Diagnosis not present

## 2018-09-04 DIAGNOSIS — R109 Unspecified abdominal pain: Secondary | ICD-10-CM | POA: Diagnosis not present

## 2018-09-04 DIAGNOSIS — J309 Allergic rhinitis, unspecified: Secondary | ICD-10-CM | POA: Diagnosis not present

## 2018-09-04 DIAGNOSIS — E782 Mixed hyperlipidemia: Secondary | ICD-10-CM | POA: Diagnosis not present

## 2018-09-04 DIAGNOSIS — R0602 Shortness of breath: Secondary | ICD-10-CM | POA: Diagnosis not present

## 2018-09-04 DIAGNOSIS — K219 Gastro-esophageal reflux disease without esophagitis: Secondary | ICD-10-CM | POA: Diagnosis not present

## 2018-09-04 DIAGNOSIS — R9082 White matter disease, unspecified: Secondary | ICD-10-CM | POA: Diagnosis not present

## 2018-09-04 DIAGNOSIS — R55 Syncope and collapse: Secondary | ICD-10-CM | POA: Diagnosis not present

## 2018-09-05 DIAGNOSIS — E782 Mixed hyperlipidemia: Secondary | ICD-10-CM | POA: Diagnosis not present

## 2018-09-05 DIAGNOSIS — N400 Enlarged prostate without lower urinary tract symptoms: Secondary | ICD-10-CM | POA: Diagnosis not present

## 2018-09-05 DIAGNOSIS — K219 Gastro-esophageal reflux disease without esophagitis: Secondary | ICD-10-CM | POA: Diagnosis not present

## 2018-09-05 DIAGNOSIS — R079 Chest pain, unspecified: Secondary | ICD-10-CM | POA: Diagnosis not present

## 2018-09-05 DIAGNOSIS — R55 Syncope and collapse: Secondary | ICD-10-CM | POA: Diagnosis not present

## 2018-09-05 DIAGNOSIS — I484 Atypical atrial flutter: Secondary | ICD-10-CM | POA: Diagnosis not present

## 2018-09-05 DIAGNOSIS — I959 Hypotension, unspecified: Secondary | ICD-10-CM | POA: Diagnosis not present

## 2018-09-05 DIAGNOSIS — J309 Allergic rhinitis, unspecified: Secondary | ICD-10-CM | POA: Diagnosis not present

## 2018-09-05 DIAGNOSIS — I4892 Unspecified atrial flutter: Secondary | ICD-10-CM | POA: Diagnosis not present

## 2018-09-05 DIAGNOSIS — R109 Unspecified abdominal pain: Secondary | ICD-10-CM | POA: Diagnosis not present

## 2018-09-05 DIAGNOSIS — I4891 Unspecified atrial fibrillation: Secondary | ICD-10-CM | POA: Diagnosis not present

## 2018-09-05 DIAGNOSIS — E119 Type 2 diabetes mellitus without complications: Secondary | ICD-10-CM | POA: Diagnosis not present

## 2018-09-05 DIAGNOSIS — K5730 Diverticulosis of large intestine without perforation or abscess without bleeding: Secondary | ICD-10-CM | POA: Diagnosis not present

## 2018-09-05 DIAGNOSIS — R9082 White matter disease, unspecified: Secondary | ICD-10-CM | POA: Diagnosis not present

## 2018-09-05 DIAGNOSIS — K4090 Unilateral inguinal hernia, without obstruction or gangrene, not specified as recurrent: Secondary | ICD-10-CM | POA: Diagnosis not present

## 2018-09-05 DIAGNOSIS — E785 Hyperlipidemia, unspecified: Secondary | ICD-10-CM | POA: Diagnosis not present

## 2018-09-05 DIAGNOSIS — E860 Dehydration: Secondary | ICD-10-CM | POA: Diagnosis not present

## 2018-09-05 DIAGNOSIS — G44209 Tension-type headache, unspecified, not intractable: Secondary | ICD-10-CM | POA: Diagnosis not present

## 2018-09-05 DIAGNOSIS — R002 Palpitations: Secondary | ICD-10-CM | POA: Diagnosis not present

## 2018-09-05 DIAGNOSIS — I471 Supraventricular tachycardia: Secondary | ICD-10-CM | POA: Diagnosis not present

## 2018-09-13 DIAGNOSIS — E785 Hyperlipidemia, unspecified: Secondary | ICD-10-CM | POA: Diagnosis not present

## 2018-09-13 DIAGNOSIS — D1809 Hemangioma of other sites: Secondary | ICD-10-CM | POA: Diagnosis not present

## 2018-09-13 DIAGNOSIS — K219 Gastro-esophageal reflux disease without esophagitis: Secondary | ICD-10-CM | POA: Diagnosis not present

## 2018-09-13 DIAGNOSIS — I951 Orthostatic hypotension: Secondary | ICD-10-CM | POA: Diagnosis not present

## 2018-09-13 DIAGNOSIS — J45909 Unspecified asthma, uncomplicated: Secondary | ICD-10-CM | POA: Diagnosis not present

## 2018-09-13 DIAGNOSIS — Z7901 Long term (current) use of anticoagulants: Secondary | ICD-10-CM | POA: Diagnosis not present

## 2018-09-13 DIAGNOSIS — N400 Enlarged prostate without lower urinary tract symptoms: Secondary | ICD-10-CM | POA: Diagnosis not present

## 2018-09-13 DIAGNOSIS — I4891 Unspecified atrial fibrillation: Secondary | ICD-10-CM | POA: Diagnosis not present

## 2018-09-15 DIAGNOSIS — K219 Gastro-esophageal reflux disease without esophagitis: Secondary | ICD-10-CM | POA: Diagnosis not present

## 2018-09-15 DIAGNOSIS — I951 Orthostatic hypotension: Secondary | ICD-10-CM | POA: Diagnosis not present

## 2018-09-15 DIAGNOSIS — E785 Hyperlipidemia, unspecified: Secondary | ICD-10-CM | POA: Diagnosis not present

## 2018-09-15 DIAGNOSIS — N400 Enlarged prostate without lower urinary tract symptoms: Secondary | ICD-10-CM | POA: Diagnosis not present

## 2018-09-15 DIAGNOSIS — Z7901 Long term (current) use of anticoagulants: Secondary | ICD-10-CM | POA: Diagnosis not present

## 2018-09-15 DIAGNOSIS — I4891 Unspecified atrial fibrillation: Secondary | ICD-10-CM | POA: Diagnosis not present

## 2018-09-15 DIAGNOSIS — J45909 Unspecified asthma, uncomplicated: Secondary | ICD-10-CM | POA: Diagnosis not present

## 2018-09-15 DIAGNOSIS — D1809 Hemangioma of other sites: Secondary | ICD-10-CM | POA: Diagnosis not present

## 2018-09-18 DIAGNOSIS — I951 Orthostatic hypotension: Secondary | ICD-10-CM | POA: Diagnosis not present

## 2018-09-18 DIAGNOSIS — E785 Hyperlipidemia, unspecified: Secondary | ICD-10-CM | POA: Diagnosis not present

## 2018-09-18 DIAGNOSIS — Z7901 Long term (current) use of anticoagulants: Secondary | ICD-10-CM | POA: Diagnosis not present

## 2018-09-18 DIAGNOSIS — J45909 Unspecified asthma, uncomplicated: Secondary | ICD-10-CM | POA: Diagnosis not present

## 2018-09-18 DIAGNOSIS — I4891 Unspecified atrial fibrillation: Secondary | ICD-10-CM | POA: Diagnosis not present

## 2018-09-18 DIAGNOSIS — N400 Enlarged prostate without lower urinary tract symptoms: Secondary | ICD-10-CM | POA: Diagnosis not present

## 2018-09-18 DIAGNOSIS — K219 Gastro-esophageal reflux disease without esophagitis: Secondary | ICD-10-CM | POA: Diagnosis not present

## 2018-09-18 DIAGNOSIS — D1809 Hemangioma of other sites: Secondary | ICD-10-CM | POA: Diagnosis not present

## 2018-09-19 DIAGNOSIS — K219 Gastro-esophageal reflux disease without esophagitis: Secondary | ICD-10-CM | POA: Diagnosis not present

## 2018-09-19 DIAGNOSIS — I951 Orthostatic hypotension: Secondary | ICD-10-CM | POA: Diagnosis not present

## 2018-09-19 DIAGNOSIS — N400 Enlarged prostate without lower urinary tract symptoms: Secondary | ICD-10-CM | POA: Diagnosis not present

## 2018-09-19 DIAGNOSIS — Z7901 Long term (current) use of anticoagulants: Secondary | ICD-10-CM | POA: Diagnosis not present

## 2018-09-19 DIAGNOSIS — E785 Hyperlipidemia, unspecified: Secondary | ICD-10-CM | POA: Diagnosis not present

## 2018-09-19 DIAGNOSIS — D1809 Hemangioma of other sites: Secondary | ICD-10-CM | POA: Diagnosis not present

## 2018-09-19 DIAGNOSIS — J45909 Unspecified asthma, uncomplicated: Secondary | ICD-10-CM | POA: Diagnosis not present

## 2018-09-19 DIAGNOSIS — I4891 Unspecified atrial fibrillation: Secondary | ICD-10-CM | POA: Diagnosis not present

## 2018-09-21 DIAGNOSIS — Z7901 Long term (current) use of anticoagulants: Secondary | ICD-10-CM | POA: Diagnosis not present

## 2018-09-21 DIAGNOSIS — D1809 Hemangioma of other sites: Secondary | ICD-10-CM | POA: Diagnosis not present

## 2018-09-21 DIAGNOSIS — J45909 Unspecified asthma, uncomplicated: Secondary | ICD-10-CM | POA: Diagnosis not present

## 2018-09-21 DIAGNOSIS — E785 Hyperlipidemia, unspecified: Secondary | ICD-10-CM | POA: Diagnosis not present

## 2018-09-21 DIAGNOSIS — I4891 Unspecified atrial fibrillation: Secondary | ICD-10-CM | POA: Diagnosis not present

## 2018-09-21 DIAGNOSIS — K219 Gastro-esophageal reflux disease without esophagitis: Secondary | ICD-10-CM | POA: Diagnosis not present

## 2018-09-21 DIAGNOSIS — I951 Orthostatic hypotension: Secondary | ICD-10-CM | POA: Diagnosis not present

## 2018-09-21 DIAGNOSIS — N400 Enlarged prostate without lower urinary tract symptoms: Secondary | ICD-10-CM | POA: Diagnosis not present

## 2018-09-22 ENCOUNTER — Other Ambulatory Visit: Payer: Self-pay | Admitting: Internal Medicine

## 2018-09-26 DIAGNOSIS — E782 Mixed hyperlipidemia: Secondary | ICD-10-CM | POA: Diagnosis not present

## 2018-09-26 DIAGNOSIS — R51 Headache: Secondary | ICD-10-CM | POA: Diagnosis not present

## 2018-09-26 DIAGNOSIS — Z6826 Body mass index (BMI) 26.0-26.9, adult: Secondary | ICD-10-CM | POA: Diagnosis not present

## 2018-09-26 DIAGNOSIS — R109 Unspecified abdominal pain: Secondary | ICD-10-CM | POA: Diagnosis not present

## 2018-09-26 DIAGNOSIS — K219 Gastro-esophageal reflux disease without esophagitis: Secondary | ICD-10-CM | POA: Diagnosis not present

## 2018-09-26 DIAGNOSIS — R634 Abnormal weight loss: Secondary | ICD-10-CM | POA: Diagnosis not present

## 2018-09-27 DIAGNOSIS — D1809 Hemangioma of other sites: Secondary | ICD-10-CM | POA: Diagnosis not present

## 2018-09-27 DIAGNOSIS — E785 Hyperlipidemia, unspecified: Secondary | ICD-10-CM | POA: Diagnosis not present

## 2018-09-27 DIAGNOSIS — I4891 Unspecified atrial fibrillation: Secondary | ICD-10-CM | POA: Diagnosis not present

## 2018-09-27 DIAGNOSIS — K219 Gastro-esophageal reflux disease without esophagitis: Secondary | ICD-10-CM | POA: Diagnosis not present

## 2018-09-27 DIAGNOSIS — I951 Orthostatic hypotension: Secondary | ICD-10-CM | POA: Diagnosis not present

## 2018-09-27 DIAGNOSIS — J45909 Unspecified asthma, uncomplicated: Secondary | ICD-10-CM | POA: Diagnosis not present

## 2018-09-27 DIAGNOSIS — N400 Enlarged prostate without lower urinary tract symptoms: Secondary | ICD-10-CM | POA: Diagnosis not present

## 2018-09-27 DIAGNOSIS — Z7901 Long term (current) use of anticoagulants: Secondary | ICD-10-CM | POA: Diagnosis not present

## 2018-10-06 DIAGNOSIS — R42 Dizziness and giddiness: Secondary | ICD-10-CM | POA: Diagnosis not present

## 2018-10-06 DIAGNOSIS — G3183 Dementia with Lewy bodies: Secondary | ICD-10-CM | POA: Diagnosis not present

## 2018-10-06 DIAGNOSIS — R69 Illness, unspecified: Secondary | ICD-10-CM | POA: Diagnosis not present

## 2018-10-06 DIAGNOSIS — R079 Chest pain, unspecified: Secondary | ICD-10-CM | POA: Diagnosis not present

## 2018-10-06 DIAGNOSIS — E119 Type 2 diabetes mellitus without complications: Secondary | ICD-10-CM | POA: Diagnosis not present

## 2018-10-06 DIAGNOSIS — Z803 Family history of malignant neoplasm of breast: Secondary | ICD-10-CM | POA: Diagnosis not present

## 2018-10-06 DIAGNOSIS — J45909 Unspecified asthma, uncomplicated: Secondary | ICD-10-CM | POA: Diagnosis not present

## 2018-10-06 DIAGNOSIS — I480 Paroxysmal atrial fibrillation: Secondary | ICD-10-CM | POA: Diagnosis not present

## 2018-10-06 DIAGNOSIS — R0789 Other chest pain: Secondary | ICD-10-CM | POA: Diagnosis not present

## 2018-10-06 DIAGNOSIS — R002 Palpitations: Secondary | ICD-10-CM | POA: Diagnosis not present

## 2018-10-06 DIAGNOSIS — E785 Hyperlipidemia, unspecified: Secondary | ICD-10-CM | POA: Diagnosis not present

## 2018-10-06 DIAGNOSIS — R072 Precordial pain: Secondary | ICD-10-CM | POA: Diagnosis not present

## 2018-10-06 DIAGNOSIS — I10 Essential (primary) hypertension: Secondary | ICD-10-CM | POA: Diagnosis not present

## 2018-10-06 DIAGNOSIS — Z8042 Family history of malignant neoplasm of prostate: Secondary | ICD-10-CM | POA: Diagnosis not present

## 2018-10-07 DIAGNOSIS — R079 Chest pain, unspecified: Secondary | ICD-10-CM | POA: Diagnosis not present

## 2018-10-07 DIAGNOSIS — R002 Palpitations: Secondary | ICD-10-CM | POA: Diagnosis not present

## 2018-10-07 DIAGNOSIS — E782 Mixed hyperlipidemia: Secondary | ICD-10-CM | POA: Diagnosis not present

## 2018-10-07 DIAGNOSIS — R42 Dizziness and giddiness: Secondary | ICD-10-CM | POA: Diagnosis not present

## 2018-10-07 DIAGNOSIS — K219 Gastro-esophageal reflux disease without esophagitis: Secondary | ICD-10-CM | POA: Diagnosis not present

## 2018-10-07 DIAGNOSIS — E785 Hyperlipidemia, unspecified: Secondary | ICD-10-CM | POA: Diagnosis not present

## 2018-10-07 DIAGNOSIS — E669 Obesity, unspecified: Secondary | ICD-10-CM | POA: Diagnosis not present

## 2018-10-07 DIAGNOSIS — I10 Essential (primary) hypertension: Secondary | ICD-10-CM | POA: Diagnosis not present

## 2018-10-08 DIAGNOSIS — R072 Precordial pain: Secondary | ICD-10-CM | POA: Diagnosis not present

## 2018-10-08 DIAGNOSIS — R42 Dizziness and giddiness: Secondary | ICD-10-CM | POA: Diagnosis not present

## 2018-10-08 DIAGNOSIS — K219 Gastro-esophageal reflux disease without esophagitis: Secondary | ICD-10-CM | POA: Diagnosis not present

## 2018-10-08 DIAGNOSIS — E782 Mixed hyperlipidemia: Secondary | ICD-10-CM | POA: Diagnosis not present

## 2018-10-08 DIAGNOSIS — R079 Chest pain, unspecified: Secondary | ICD-10-CM | POA: Diagnosis not present

## 2018-10-08 DIAGNOSIS — E669 Obesity, unspecified: Secondary | ICD-10-CM | POA: Diagnosis not present

## 2018-10-08 DIAGNOSIS — E119 Type 2 diabetes mellitus without complications: Secondary | ICD-10-CM | POA: Diagnosis not present

## 2018-10-09 DIAGNOSIS — E669 Obesity, unspecified: Secondary | ICD-10-CM | POA: Diagnosis not present

## 2018-10-09 DIAGNOSIS — K219 Gastro-esophageal reflux disease without esophagitis: Secondary | ICD-10-CM | POA: Diagnosis not present

## 2018-10-09 DIAGNOSIS — R079 Chest pain, unspecified: Secondary | ICD-10-CM | POA: Diagnosis not present

## 2018-10-09 DIAGNOSIS — R42 Dizziness and giddiness: Secondary | ICD-10-CM | POA: Diagnosis not present

## 2018-10-09 DIAGNOSIS — E782 Mixed hyperlipidemia: Secondary | ICD-10-CM | POA: Diagnosis not present

## 2018-10-11 DIAGNOSIS — R55 Syncope and collapse: Secondary | ICD-10-CM | POA: Diagnosis not present

## 2018-10-11 DIAGNOSIS — K219 Gastro-esophageal reflux disease without esophagitis: Secondary | ICD-10-CM | POA: Diagnosis not present

## 2018-10-11 DIAGNOSIS — R072 Precordial pain: Secondary | ICD-10-CM | POA: Diagnosis not present

## 2018-10-11 DIAGNOSIS — E669 Obesity, unspecified: Secondary | ICD-10-CM | POA: Diagnosis not present

## 2018-10-11 DIAGNOSIS — I480 Paroxysmal atrial fibrillation: Secondary | ICD-10-CM | POA: Diagnosis not present

## 2018-10-11 DIAGNOSIS — Z7901 Long term (current) use of anticoagulants: Secondary | ICD-10-CM | POA: Diagnosis not present

## 2018-10-11 DIAGNOSIS — R42 Dizziness and giddiness: Secondary | ICD-10-CM | POA: Diagnosis not present

## 2018-10-11 DIAGNOSIS — J309 Allergic rhinitis, unspecified: Secondary | ICD-10-CM | POA: Diagnosis not present

## 2018-10-11 DIAGNOSIS — E785 Hyperlipidemia, unspecified: Secondary | ICD-10-CM | POA: Diagnosis not present

## 2018-10-11 DIAGNOSIS — I2510 Atherosclerotic heart disease of native coronary artery without angina pectoris: Secondary | ICD-10-CM | POA: Diagnosis not present

## 2018-10-11 DIAGNOSIS — I4891 Unspecified atrial fibrillation: Secondary | ICD-10-CM | POA: Diagnosis not present

## 2018-10-11 DIAGNOSIS — R079 Chest pain, unspecified: Secondary | ICD-10-CM | POA: Diagnosis not present

## 2018-10-11 DIAGNOSIS — E119 Type 2 diabetes mellitus without complications: Secondary | ICD-10-CM | POA: Diagnosis not present

## 2018-10-11 DIAGNOSIS — Z885 Allergy status to narcotic agent status: Secondary | ICD-10-CM | POA: Diagnosis not present

## 2018-10-11 DIAGNOSIS — E782 Mixed hyperlipidemia: Secondary | ICD-10-CM | POA: Diagnosis not present

## 2018-10-11 DIAGNOSIS — R002 Palpitations: Secondary | ICD-10-CM | POA: Diagnosis not present

## 2018-10-11 DIAGNOSIS — R109 Unspecified abdominal pain: Secondary | ICD-10-CM | POA: Diagnosis not present

## 2018-10-11 DIAGNOSIS — G629 Polyneuropathy, unspecified: Secondary | ICD-10-CM | POA: Diagnosis not present

## 2018-10-11 DIAGNOSIS — Z823 Family history of stroke: Secondary | ICD-10-CM | POA: Diagnosis not present

## 2018-10-11 DIAGNOSIS — R0789 Other chest pain: Secondary | ICD-10-CM | POA: Diagnosis not present

## 2018-10-11 DIAGNOSIS — I208 Other forms of angina pectoris: Secondary | ICD-10-CM | POA: Diagnosis not present

## 2018-10-12 DIAGNOSIS — E669 Obesity, unspecified: Secondary | ICD-10-CM | POA: Diagnosis not present

## 2018-10-12 DIAGNOSIS — R079 Chest pain, unspecified: Secondary | ICD-10-CM | POA: Diagnosis not present

## 2018-10-12 DIAGNOSIS — K219 Gastro-esophageal reflux disease without esophagitis: Secondary | ICD-10-CM | POA: Diagnosis not present

## 2018-10-12 DIAGNOSIS — I4891 Unspecified atrial fibrillation: Secondary | ICD-10-CM | POA: Diagnosis not present

## 2018-10-12 DIAGNOSIS — R55 Syncope and collapse: Secondary | ICD-10-CM | POA: Diagnosis not present

## 2018-10-12 DIAGNOSIS — R002 Palpitations: Secondary | ICD-10-CM | POA: Diagnosis not present

## 2018-10-12 DIAGNOSIS — R42 Dizziness and giddiness: Secondary | ICD-10-CM | POA: Diagnosis not present

## 2018-10-12 DIAGNOSIS — R109 Unspecified abdominal pain: Secondary | ICD-10-CM | POA: Diagnosis not present

## 2018-10-12 DIAGNOSIS — E782 Mixed hyperlipidemia: Secondary | ICD-10-CM | POA: Diagnosis not present

## 2018-10-13 DIAGNOSIS — R079 Chest pain, unspecified: Secondary | ICD-10-CM | POA: Diagnosis not present

## 2018-10-13 DIAGNOSIS — R55 Syncope and collapse: Secondary | ICD-10-CM | POA: Diagnosis not present

## 2018-10-13 DIAGNOSIS — R42 Dizziness and giddiness: Secondary | ICD-10-CM | POA: Diagnosis not present

## 2018-10-13 DIAGNOSIS — R002 Palpitations: Secondary | ICD-10-CM | POA: Diagnosis not present

## 2018-10-17 DIAGNOSIS — K219 Gastro-esophageal reflux disease without esophagitis: Secondary | ICD-10-CM | POA: Diagnosis not present

## 2018-10-17 DIAGNOSIS — E6609 Other obesity due to excess calories: Secondary | ICD-10-CM | POA: Diagnosis not present

## 2018-10-17 DIAGNOSIS — R42 Dizziness and giddiness: Secondary | ICD-10-CM | POA: Diagnosis not present

## 2018-10-17 DIAGNOSIS — N400 Enlarged prostate without lower urinary tract symptoms: Secondary | ICD-10-CM | POA: Diagnosis not present

## 2018-10-17 DIAGNOSIS — E782 Mixed hyperlipidemia: Secondary | ICD-10-CM | POA: Diagnosis not present

## 2018-10-17 DIAGNOSIS — I480 Paroxysmal atrial fibrillation: Secondary | ICD-10-CM | POA: Diagnosis not present

## 2018-10-24 DIAGNOSIS — Z6827 Body mass index (BMI) 27.0-27.9, adult: Secondary | ICD-10-CM | POA: Diagnosis not present

## 2018-10-24 DIAGNOSIS — G4700 Insomnia, unspecified: Secondary | ICD-10-CM | POA: Diagnosis not present

## 2018-10-24 DIAGNOSIS — K219 Gastro-esophageal reflux disease without esophagitis: Secondary | ICD-10-CM | POA: Diagnosis not present

## 2018-10-24 DIAGNOSIS — E782 Mixed hyperlipidemia: Secondary | ICD-10-CM | POA: Diagnosis not present

## 2018-10-24 DIAGNOSIS — R51 Headache: Secondary | ICD-10-CM | POA: Diagnosis not present

## 2018-10-24 DIAGNOSIS — R5383 Other fatigue: Secondary | ICD-10-CM | POA: Diagnosis not present

## 2018-10-27 ENCOUNTER — Telehealth: Payer: Self-pay | Admitting: Internal Medicine

## 2018-10-27 NOTE — Telephone Encounter (Signed)
Called [facility] about CPAP machine. They advised that patient would need to be seen again and they would need a new order since >6 months since he has been seen.  Called and advised patient, he will call when he is ready to schedule this apt. Nothing further needed.

## 2019-01-31 ENCOUNTER — Telehealth: Payer: Self-pay | Admitting: Urology

## 2019-01-31 NOTE — Telephone Encounter (Signed)
Called patient no answer could not leave vmail

## 2019-01-31 NOTE — Telephone Encounter (Signed)
Patient was seen June 2019 for an elevated PSA and never followed up.  He was mailed a letter in October 2019 to schedule follow-up but did not contact the office.    In Care Everywhere there was an injury from [facility] in [place] ordering a CT scan CT scan for a diagnosis of prostate cancer.  Please verify that patient is being seen by another urologist.  Thanks

## 2019-06-10 ENCOUNTER — Telehealth: Payer: Self-pay | Admitting: Urology

## 2019-06-10 NOTE — Telephone Encounter (Signed)
Patient was seen June 2019 for an elevated PSA and never followed up.  He was mailed a letter in October 2019 to schedule follow-up but did not contact the office.    In Care Everywhere there was an entry from [facility] in [place] ordering a CT scan CT scan for a diagnosis of prostate cancer.  Please verify that patient is being seen by another urologist.   Thanks

## 2019-06-12 NOTE — Telephone Encounter (Addendum)
LMOM for patient to return call.

## 2019-06-15 NOTE — Telephone Encounter (Signed)
Called pt, no answer. Unable to leave message as voicemail is full. 2nd attempt.

## 2019-06-18 NOTE — Telephone Encounter (Signed)
Called pt no answer. 3rd attempt. Left detailed message for pt to call office back.

## 2019-10-24 IMAGING — CR DG CHEST 2V
1 series · 2 of 2 positions shown · non-contrast
Comparison: Radiographs October 24, 2017.

CLINICAL DATA: Cough.

EXAM:
CHEST - 2 VIEW

[Series 1: dg chest 2 view · 0.14mm/px · 2 of 2 slices shown]
[im 1/2]
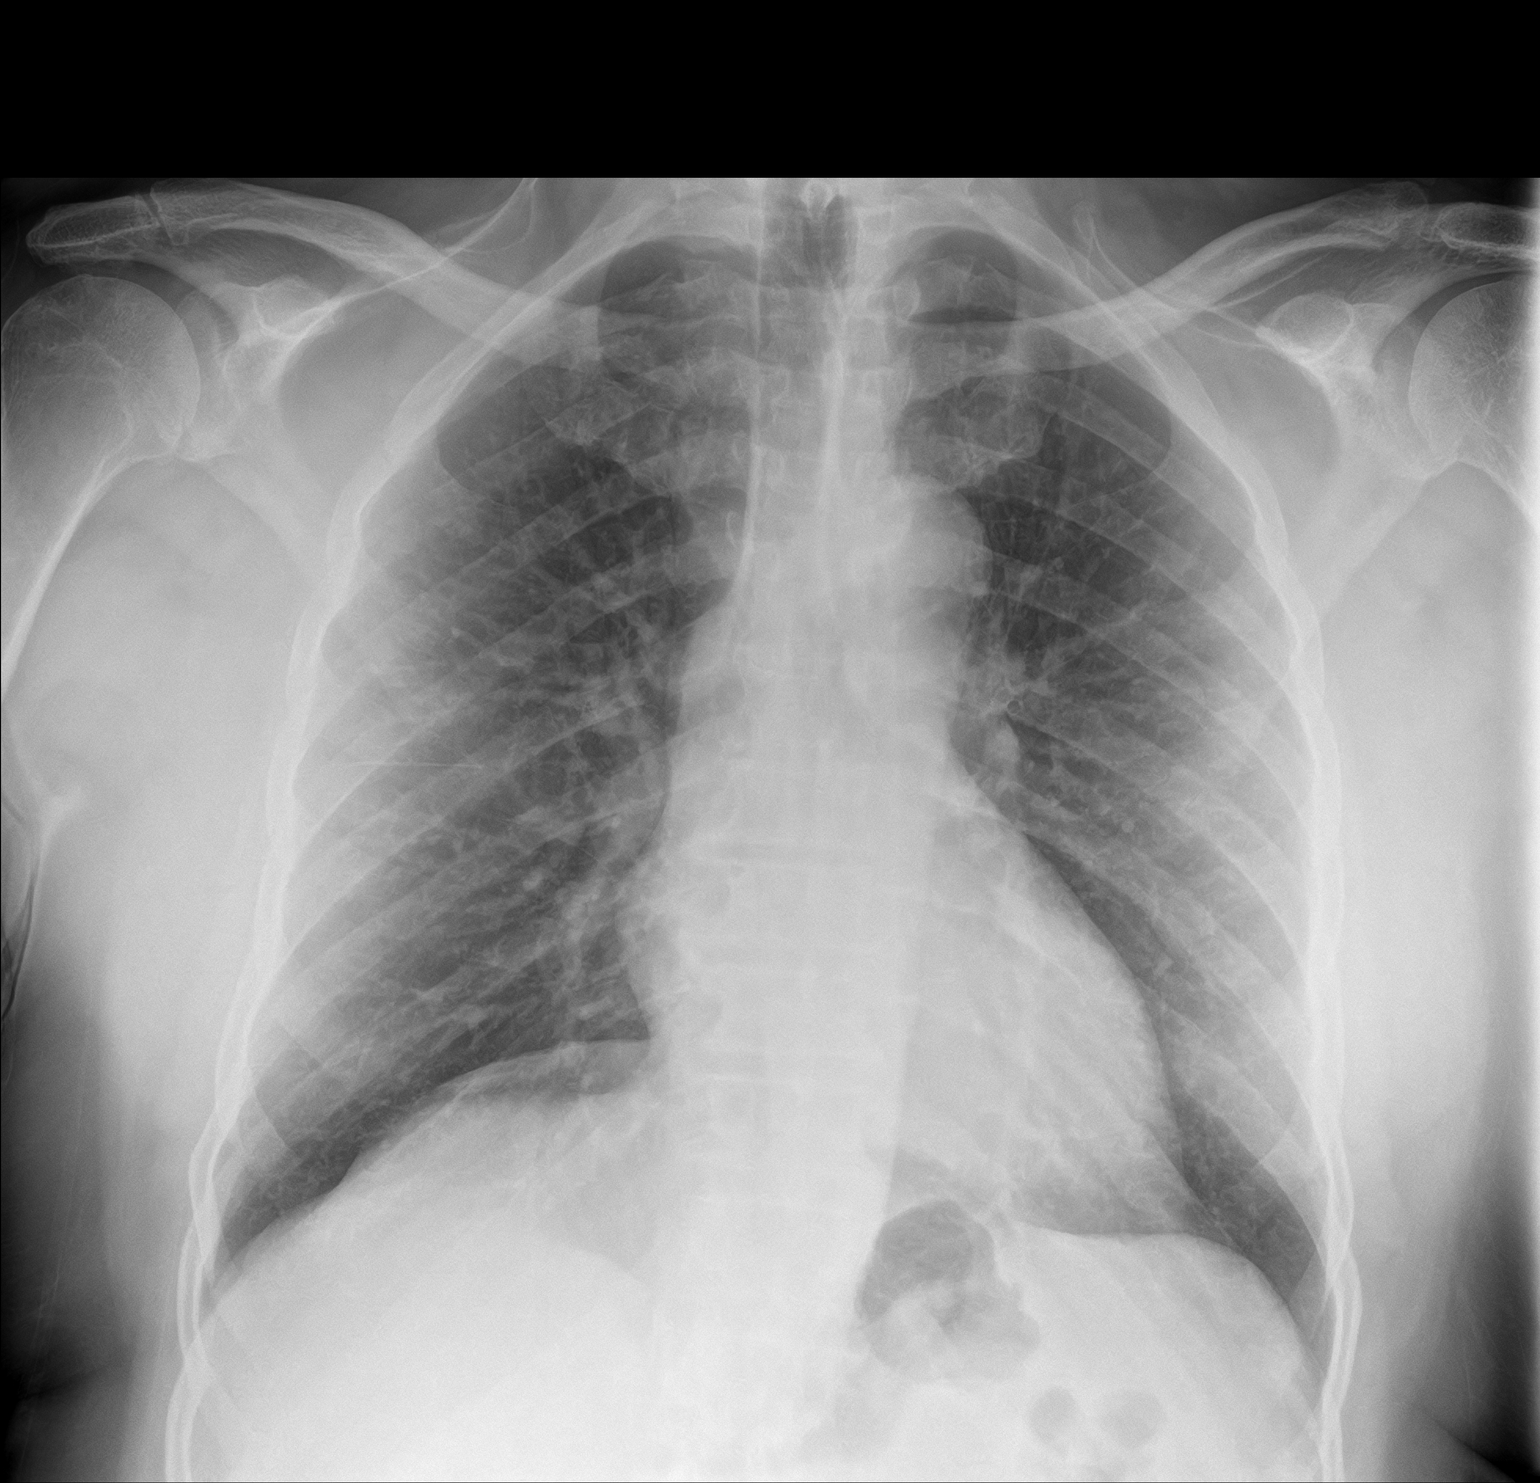
[im 2/2]
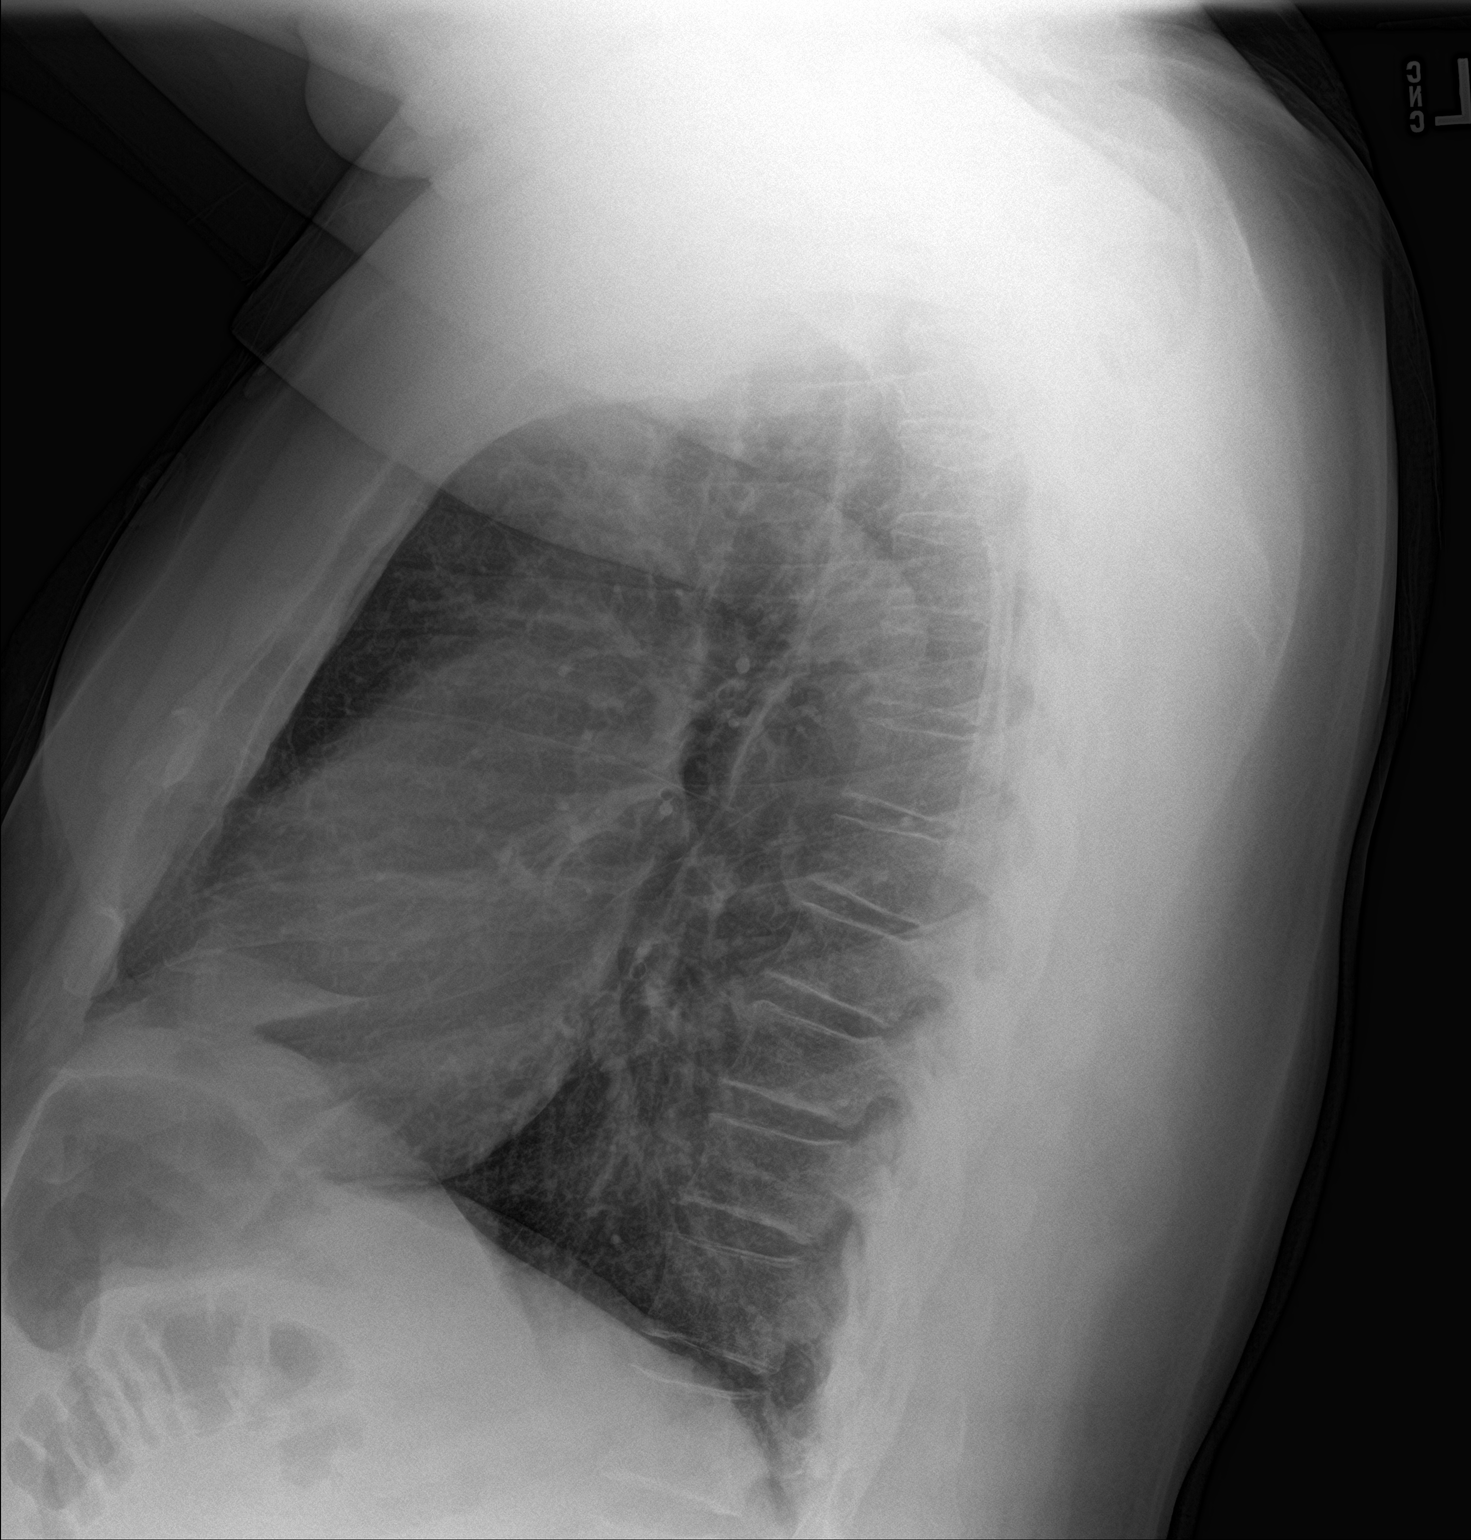

[2 of 2 positions shown; findings below may reference images not displayed]

FINDINGS: The heart size and mediastinal contours are within normal limits.
Both lungs are clear. No pneumothorax or pleural effusion is noted.
The visualized skeletal structures are unremarkable.
IMPRESSION: No active cardiopulmonary disease.

## 2019-10-24 IMAGING — CT CT HEAD W/O CM
3 series · 14 of 46 positions shown, 16 images · non-contrast
Comparison: None.

CLINICAL DATA: Fall, head trauma 2 days ago.  Ataxia

EXAM:
CT HEAD WITHOUT CONTRAST
TECHNIQUE: Contiguous axial images were obtained from the base of the skull
through the vertex without intravenous contrast.

[Series 2: head wo · axial · 0.47mm/px · z∈[-124,-4]mm · 8 of 29 slices shown, 10 images]
[im 3/29  brain]
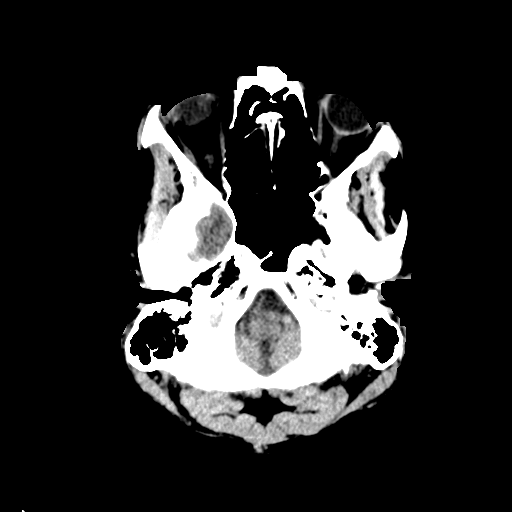
[im 3/29  bone]
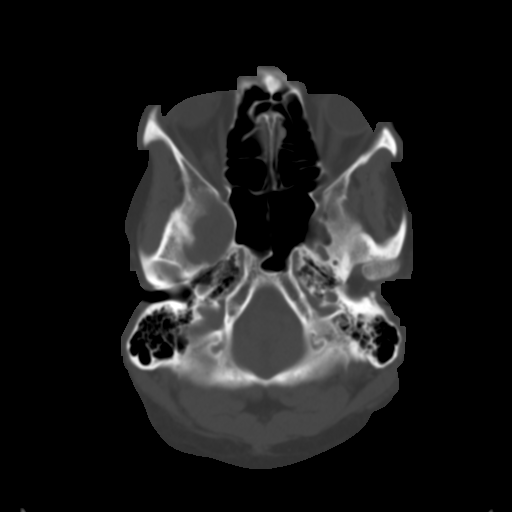
[im 7/29  brain]
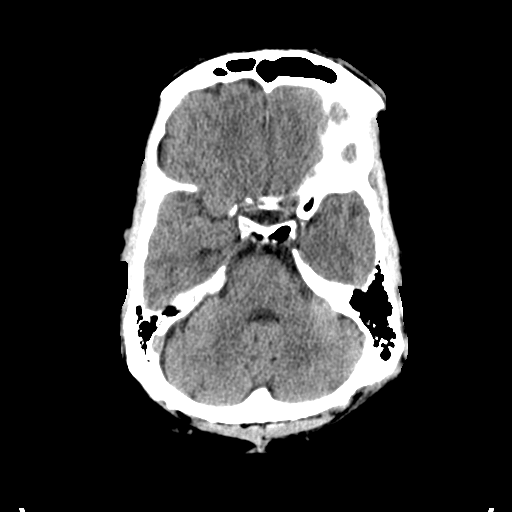
[im 10/29  brain]
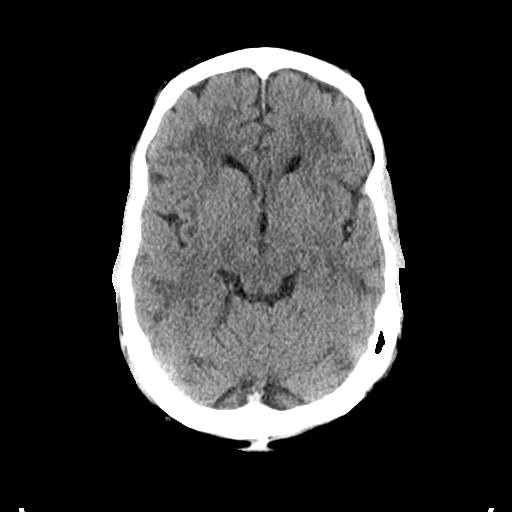
[im 13/29  brain]
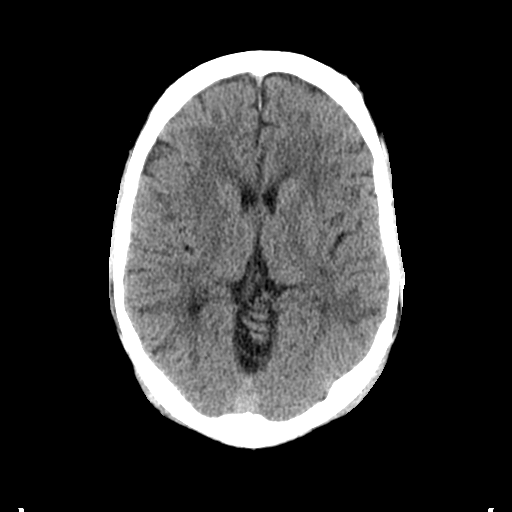
[im 17/29  brain]
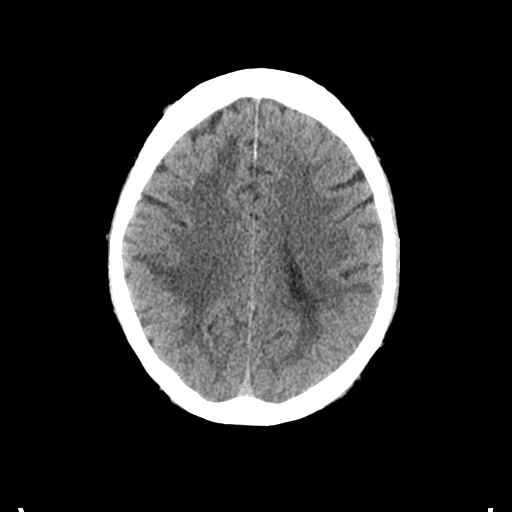
[im 17/29  bone]
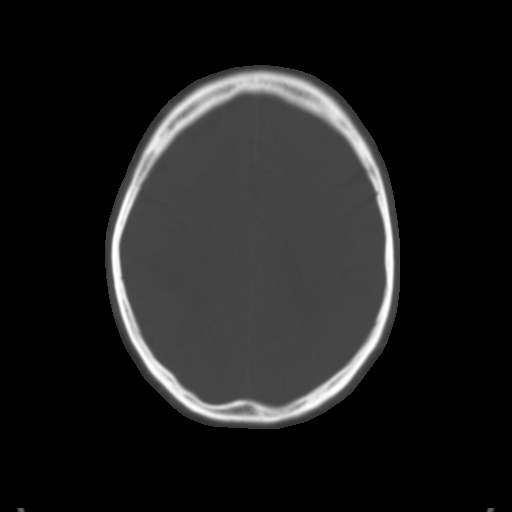
[im 20/29  brain]
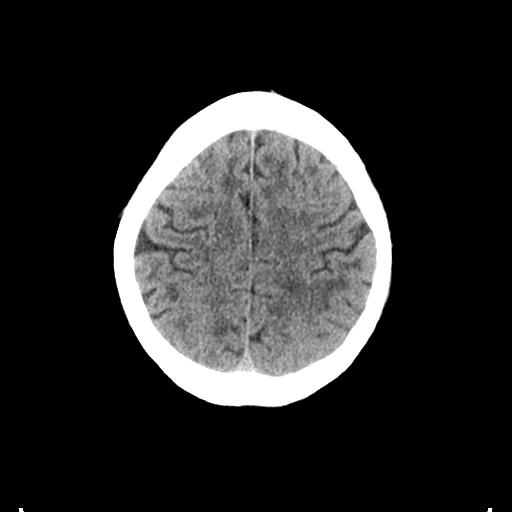
[im 23/29  brain]
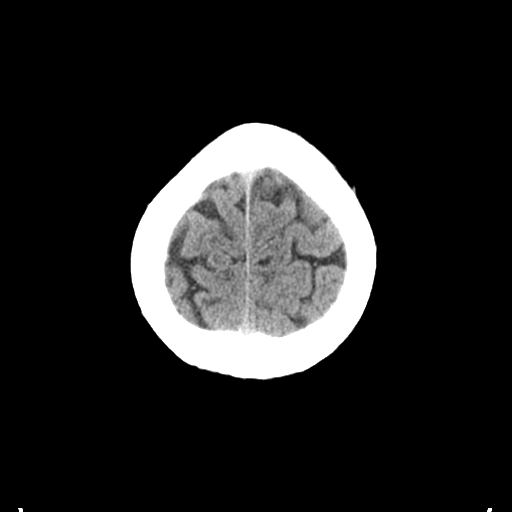
[im 27/29  brain]
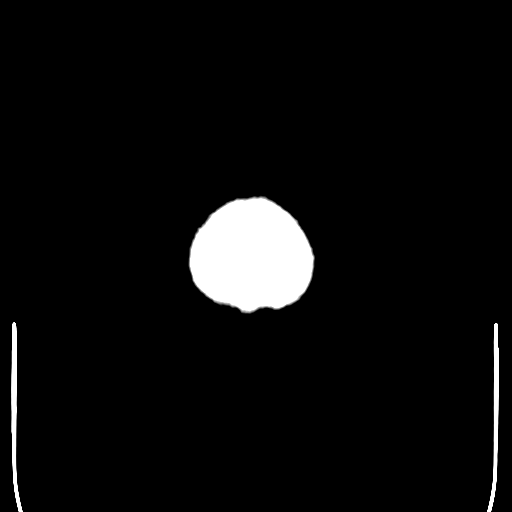

[Series 4: coronal soft tissue · coronal · 0.28mm/px · 3 of 63 slices shown]
[im 21/63  brain]
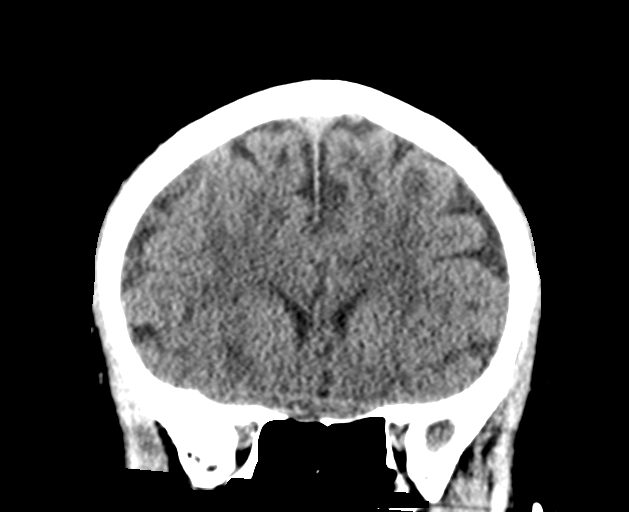
[im 28/63  brain]
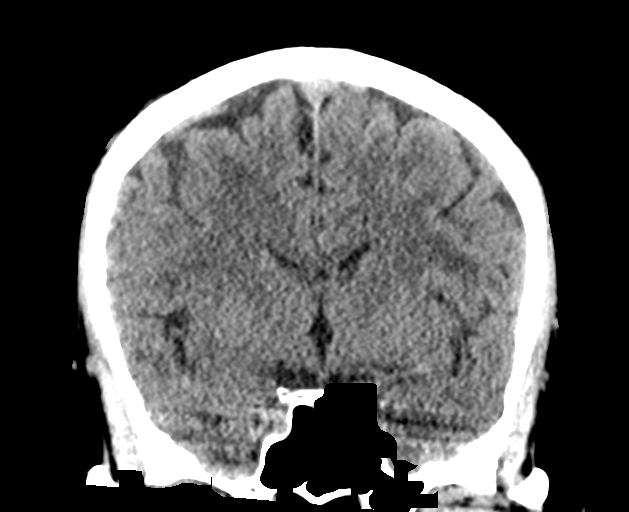
[im 35/63  brain]
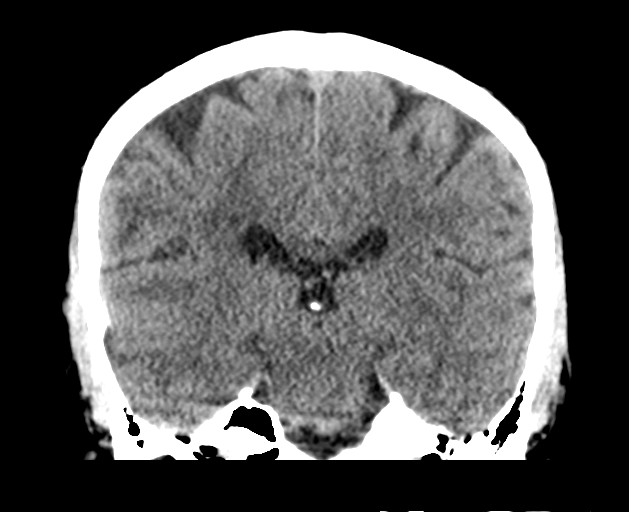

[Series 5: sagittal soft tissue · sagittal · 0.28mm/px · 3 of 50 slices shown]
[im 17/50  brain]
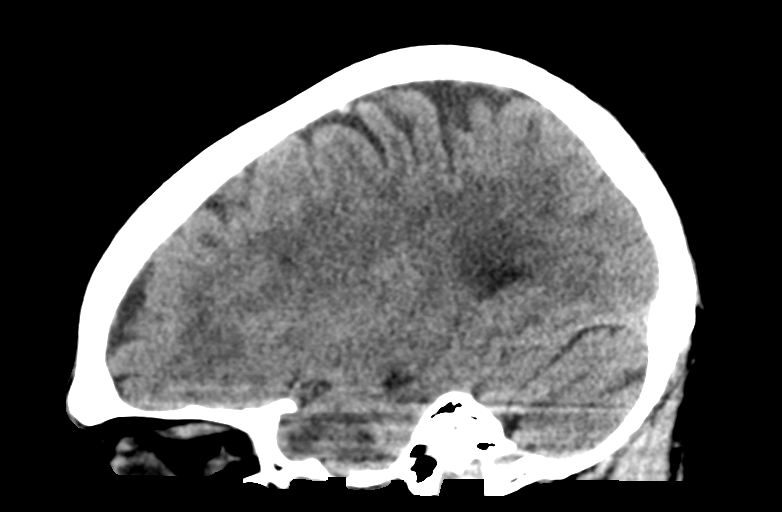
[im 25/50  brain]
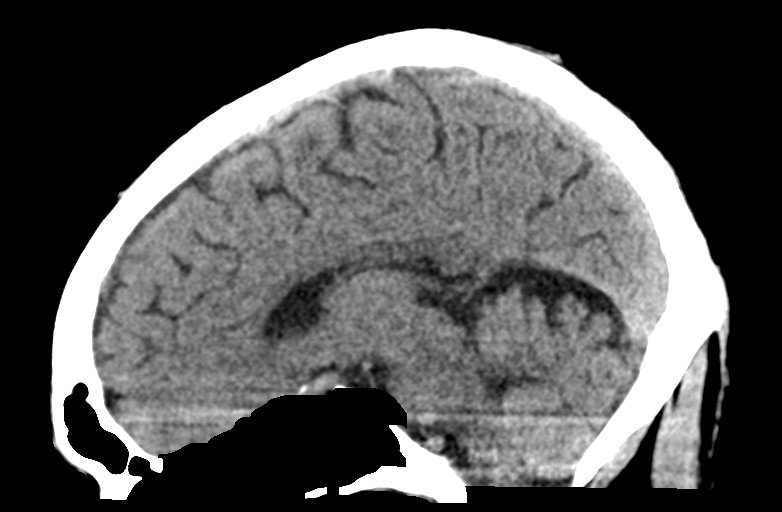
[im 33/50  brain]
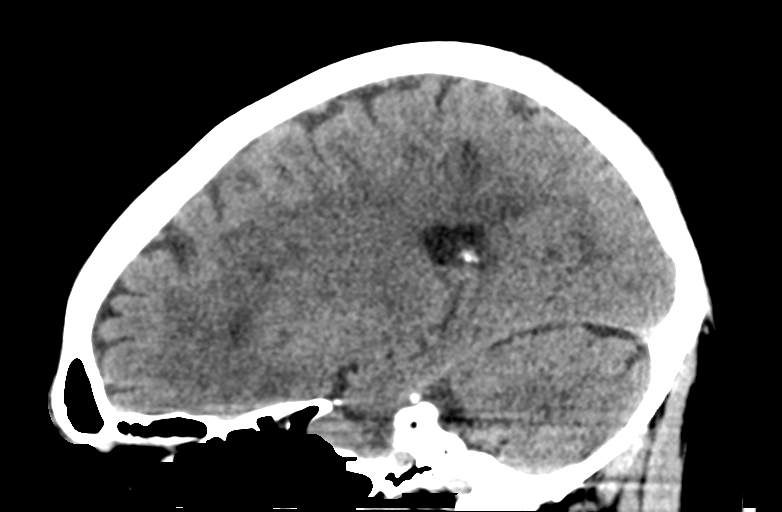

[14 of 46 positions shown; findings below may reference images not displayed]

FINDINGS: Brain: Cerebral volume and ventricles normal.

Extensive white matter hypodensity diffusely and bilaterally. Focal
hypodensity in the right frontal parietal lobe extending to cortex
could represent acute infarct. Similar hypodensity in the left
frontal parietal cortex. Negative for hemorrhage.

Vascular: Normal arterial flow voids

Skull: Negative

Sinuses/Orbits: Negative

Other: None
IMPRESSION: Diffuse white matter abnormality likely due to chronic microvascular
ischemia. Focal areas of hypodensity in the frontal parietal cortex
bilaterally could represent acute infarct or possibly edema from
tumor. MRI brain without and with contrast recommended for further
evaluation
